# Patient Record
Sex: Female | Born: 1950 | Race: Black or African American | Hispanic: No | Marital: Single | Smoking: Former smoker
Health system: Southern US, Community
[De-identification: ages and names within clinical notes are randomized; demographics above are authoritative.]

## PROBLEM LIST (undated history)

## (undated) DIAGNOSIS — E7800 Pure hypercholesterolemia, unspecified: Secondary | ICD-10-CM

## (undated) DIAGNOSIS — I10 Essential (primary) hypertension: Secondary | ICD-10-CM

## (undated) DIAGNOSIS — J449 Chronic obstructive pulmonary disease, unspecified: Secondary | ICD-10-CM

## (undated) DIAGNOSIS — J45909 Unspecified asthma, uncomplicated: Secondary | ICD-10-CM

## (undated) DIAGNOSIS — R011 Cardiac murmur, unspecified: Secondary | ICD-10-CM

## (undated) DIAGNOSIS — D649 Anemia, unspecified: Secondary | ICD-10-CM

## (undated) DIAGNOSIS — K922 Gastrointestinal hemorrhage, unspecified: Secondary | ICD-10-CM

## (undated) HISTORY — PX: TUBAL LIGATION: SHX77

## (undated) HISTORY — DX: Anemia, unspecified: D64.9

## (undated) HISTORY — PX: HERNIA REPAIR: SHX51

## (undated) HISTORY — DX: Gastrointestinal hemorrhage, unspecified: K92.2

---

## 2005-07-28 ENCOUNTER — Other Ambulatory Visit: Payer: Self-pay

## 2005-07-28 ENCOUNTER — Emergency Department: Payer: Self-pay | Admitting: Emergency Medicine

## 2006-03-16 ENCOUNTER — Inpatient Hospital Stay: Payer: Self-pay | Admitting: Internal Medicine

## 2007-03-28 ENCOUNTER — Ambulatory Visit: Payer: Self-pay

## 2007-04-03 ENCOUNTER — Ambulatory Visit: Payer: Self-pay

## 2007-04-25 ENCOUNTER — Emergency Department: Payer: Self-pay | Admitting: Emergency Medicine

## 2007-04-25 ENCOUNTER — Other Ambulatory Visit: Payer: Self-pay

## 2007-10-10 ENCOUNTER — Ambulatory Visit: Payer: Self-pay

## 2008-03-07 ENCOUNTER — Emergency Department: Payer: Self-pay | Admitting: Emergency Medicine

## 2008-08-26 ENCOUNTER — Emergency Department: Payer: Self-pay | Admitting: Emergency Medicine

## 2009-09-10 ENCOUNTER — Inpatient Hospital Stay: Payer: Self-pay | Admitting: Specialist

## 2009-10-26 ENCOUNTER — Ambulatory Visit: Payer: Self-pay | Admitting: Specialist

## 2011-09-06 ENCOUNTER — Emergency Department: Payer: Self-pay | Admitting: Emergency Medicine

## 2011-09-06 LAB — COMPREHENSIVE METABOLIC PANEL
Albumin: 3 g/dL — ABNORMAL LOW (ref 3.4–5.0)
Anion Gap: 9 (ref 7–16)
BUN: 11 mg/dL (ref 7–18)
Glucose: 108 mg/dL — ABNORMAL HIGH (ref 65–99)
Potassium: 3.3 mmol/L — ABNORMAL LOW (ref 3.5–5.1)
SGOT(AST): 22 U/L (ref 15–37)
SGPT (ALT): 17 U/L
Total Protein: 8.2 g/dL (ref 6.4–8.2)

## 2011-09-06 LAB — CK TOTAL AND CKMB (NOT AT ARMC)
CK, Total: 112 U/L (ref 21–215)
CK-MB: 0.7 ng/mL (ref 0.5–3.6)

## 2011-09-06 LAB — CBC
HCT: 38.2 % (ref 35.0–47.0)
HGB: 12.3 g/dL (ref 12.0–16.0)
MCH: 25 pg — ABNORMAL LOW (ref 26.0–34.0)
MCV: 78 fL — ABNORMAL LOW (ref 80–100)
Platelet: 319 10*3/uL (ref 150–440)
RBC: 4.91 10*6/uL (ref 3.80–5.20)
RDW: 17.5 % — ABNORMAL HIGH (ref 11.5–14.5)
WBC: 9.8 10*3/uL (ref 3.6–11.0)

## 2011-09-06 LAB — URINALYSIS, COMPLETE
Bacteria: NONE SEEN
Glucose,UR: NEGATIVE mg/dL (ref 0–75)
Leukocyte Esterase: NEGATIVE
Nitrite: NEGATIVE
Protein: NEGATIVE
RBC,UR: 1 /HPF (ref 0–5)
WBC UR: 1 /HPF (ref 0–5)

## 2012-01-08 ENCOUNTER — Emergency Department: Payer: Self-pay | Admitting: Unknown Physician Specialty

## 2012-01-08 LAB — CBC
HCT: 38.9 % (ref 35.0–47.0)
HGB: 12.9 g/dL (ref 12.0–16.0)
MCH: 26 pg (ref 26.0–34.0)
MCHC: 33.2 g/dL (ref 32.0–36.0)
MCV: 78 fL — ABNORMAL LOW (ref 80–100)
Platelet: 325 10*3/uL (ref 150–440)
RBC: 4.97 10*6/uL (ref 3.80–5.20)
RDW: 17.5 % — ABNORMAL HIGH (ref 11.5–14.5)
WBC: 9.3 10*3/uL (ref 3.6–11.0)

## 2012-01-08 LAB — COMPREHENSIVE METABOLIC PANEL
Albumin: 3.1 g/dL — ABNORMAL LOW (ref 3.4–5.0)
Alkaline Phosphatase: 152 U/L — ABNORMAL HIGH (ref 50–136)
Anion Gap: 8 (ref 7–16)
BUN: 11 mg/dL (ref 7–18)
Calcium, Total: 8.9 mg/dL (ref 8.5–10.1)
Co2: 24 mmol/L (ref 21–32)
EGFR (Non-African Amer.): 45 — ABNORMAL LOW
Glucose: 88 mg/dL (ref 65–99)
Potassium: 3.9 mmol/L (ref 3.5–5.1)
SGPT (ALT): 18 U/L (ref 12–78)
Sodium: 141 mmol/L (ref 136–145)

## 2012-01-08 LAB — SEDIMENTATION RATE: Erythrocyte Sed Rate: 18 mm/hr (ref 0–30)

## 2012-04-11 ENCOUNTER — Ambulatory Visit: Payer: Self-pay

## 2012-06-05 ENCOUNTER — Emergency Department: Payer: Self-pay | Admitting: Emergency Medicine

## 2012-06-05 LAB — BASIC METABOLIC PANEL
BUN: 11 mg/dL (ref 7–18)
Calcium, Total: 8.8 mg/dL (ref 8.5–10.1)
Chloride: 103 mmol/L (ref 98–107)
Co2: 26 mmol/L (ref 21–32)
Creatinine: 1.58 mg/dL — ABNORMAL HIGH (ref 0.60–1.30)
EGFR (Non-African Amer.): 35 — ABNORMAL LOW
Glucose: 104 mg/dL — ABNORMAL HIGH (ref 65–99)
Osmolality: 272 (ref 275–301)
Potassium: 3.6 mmol/L (ref 3.5–5.1)
Sodium: 136 mmol/L (ref 136–145)

## 2012-06-05 LAB — CBC
HCT: 39.3 % (ref 35.0–47.0)
HGB: 13 g/dL (ref 12.0–16.0)
MCH: 25.4 pg — ABNORMAL LOW (ref 26.0–34.0)
MCV: 77 fL — ABNORMAL LOW (ref 80–100)
Platelet: 342 10*3/uL (ref 150–440)
RBC: 5.11 10*6/uL (ref 3.80–5.20)
WBC: 5.9 10*3/uL (ref 3.6–11.0)

## 2012-09-19 ENCOUNTER — Ambulatory Visit: Payer: Self-pay | Admitting: Family Medicine

## 2013-04-14 ENCOUNTER — Emergency Department: Payer: Self-pay | Admitting: Emergency Medicine

## 2013-04-14 LAB — COMPREHENSIVE METABOLIC PANEL
ALT: 15 U/L (ref 12–78)
ANION GAP: 6 — AB (ref 7–16)
Albumin: 3.1 g/dL — ABNORMAL LOW (ref 3.4–5.0)
Alkaline Phosphatase: 137 U/L — ABNORMAL HIGH
BUN: 12 mg/dL (ref 7–18)
Bilirubin,Total: 0.4 mg/dL (ref 0.2–1.0)
Calcium, Total: 9 mg/dL (ref 8.5–10.1)
Chloride: 107 mmol/L (ref 98–107)
Co2: 24 mmol/L (ref 21–32)
Creatinine: 1.43 mg/dL — ABNORMAL HIGH (ref 0.60–1.30)
EGFR (African American): 46 — ABNORMAL LOW
GFR CALC NON AF AMER: 40 — AB
GLUCOSE: 92 mg/dL (ref 65–99)
Osmolality: 273 (ref 275–301)
Potassium: 3.8 mmol/L (ref 3.5–5.1)
SGOT(AST): 13 U/L — ABNORMAL LOW (ref 15–37)
SODIUM: 137 mmol/L (ref 136–145)
TOTAL PROTEIN: 8.2 g/dL (ref 6.4–8.2)

## 2013-04-14 LAB — CBC
HCT: 41.4 % (ref 35.0–47.0)
HGB: 13.3 g/dL (ref 12.0–16.0)
MCH: 25.7 pg — ABNORMAL LOW (ref 26.0–34.0)
MCHC: 32.1 g/dL (ref 32.0–36.0)
MCV: 80 fL (ref 80–100)
Platelet: 308 10*3/uL (ref 150–440)
RBC: 5.18 10*6/uL (ref 3.80–5.20)
RDW: 15.9 % — ABNORMAL HIGH (ref 11.5–14.5)
WBC: 12.2 10*3/uL — ABNORMAL HIGH (ref 3.6–11.0)

## 2013-04-17 ENCOUNTER — Inpatient Hospital Stay: Payer: Self-pay | Admitting: Specialist

## 2013-04-17 LAB — COMPREHENSIVE METABOLIC PANEL
AST: 38 U/L — AB (ref 15–37)
Albumin: 2.4 g/dL — ABNORMAL LOW (ref 3.4–5.0)
Alkaline Phosphatase: 116 U/L
Anion Gap: 1 — ABNORMAL LOW (ref 7–16)
BUN: 14 mg/dL (ref 7–18)
Bilirubin,Total: 0.4 mg/dL (ref 0.2–1.0)
CHLORIDE: 106 mmol/L (ref 98–107)
CREATININE: 0.99 mg/dL (ref 0.60–1.30)
Calcium, Total: 8.6 mg/dL (ref 8.5–10.1)
Co2: 26 mmol/L (ref 21–32)
EGFR (African American): >60
EGFR (Non-African Amer.): >60
Glucose: 90 mg/dL (ref 65–99)
Osmolality: 266 (ref 275–301)
Potassium: 5.7 mmol/L — ABNORMAL HIGH (ref 3.5–5.1)
SGPT (ALT): 12 U/L (ref 12–78)
SODIUM: 133 mmol/L — AB (ref 136–145)
Total Protein: 7.4 g/dL (ref 6.4–8.2)

## 2013-04-17 LAB — CBC WITH DIFFERENTIAL/PLATELET
BASOS ABS: 0.1 10*3/uL (ref 0.0–0.1)
Basophil %: 0.4 %
EOS ABS: 0 10*3/uL (ref 0.0–0.7)
Eosinophil %: 0.3 %
HCT: 35.3 % (ref 35.0–47.0)
HGB: 11.7 g/dL — ABNORMAL LOW (ref 12.0–16.0)
Lymphocyte #: 1 10*3/uL (ref 1.0–3.6)
Lymphocyte %: 6.8 %
MCH: 26.2 pg (ref 26.0–34.0)
MCHC: 33.2 g/dL (ref 32.0–36.0)
MCV: 79 fL — ABNORMAL LOW (ref 80–100)
MONO ABS: 0.8 x10 3/mm (ref 0.2–0.9)
Monocyte %: 5.8 %
NEUTROS PCT: 86.7 %
Neutrophil #: 12.1 10*3/uL — ABNORMAL HIGH (ref 1.4–6.5)
PLATELETS: 288 10*3/uL (ref 150–440)
RBC: 4.48 10*6/uL (ref 3.80–5.20)
RDW: 15.6 % — ABNORMAL HIGH (ref 11.5–14.5)
WBC: 14 10*3/uL — ABNORMAL HIGH (ref 3.6–11.0)

## 2013-04-17 LAB — CK: CK, TOTAL: 75 U/L

## 2013-04-18 LAB — BASIC METABOLIC PANEL
Anion Gap: 6 — ABNORMAL LOW (ref 7–16)
BUN: 17 mg/dL (ref 7–18)
CALCIUM: 9.4 mg/dL (ref 8.5–10.1)
CO2: 26 mmol/L (ref 21–32)
CREATININE: 1.28 mg/dL (ref 0.60–1.30)
Chloride: 104 mmol/L (ref 98–107)
EGFR (African American): 53 — ABNORMAL LOW
EGFR (Non-African Amer.): 45 — ABNORMAL LOW
Glucose: 107 mg/dL — ABNORMAL HIGH (ref 65–99)
Osmolality: 274 (ref 275–301)
Potassium: 4 mmol/L (ref 3.5–5.1)
Sodium: 136 mmol/L (ref 136–145)

## 2013-04-18 LAB — CBC WITH DIFFERENTIAL/PLATELET
BASOS PCT: 0.2 %
Basophil #: 0 10*3/uL (ref 0.0–0.1)
EOS ABS: 0 10*3/uL (ref 0.0–0.7)
Eosinophil %: 0 %
HCT: 37.7 % (ref 35.0–47.0)
HGB: 11.9 g/dL — ABNORMAL LOW (ref 12.0–16.0)
Lymphocyte #: 0.8 10*3/uL — ABNORMAL LOW (ref 1.0–3.6)
Lymphocyte %: 4.3 %
MCH: 24.8 pg — ABNORMAL LOW (ref 26.0–34.0)
MCHC: 31.4 g/dL — ABNORMAL LOW (ref 32.0–36.0)
MCV: 79 fL — ABNORMAL LOW (ref 80–100)
MONO ABS: 1 x10 3/mm — AB (ref 0.2–0.9)
MONOS PCT: 5.7 %
Neutrophil #: 16.1 10*3/uL — ABNORMAL HIGH (ref 1.4–6.5)
Neutrophil %: 89.8 %
PLATELETS: 348 10*3/uL (ref 150–440)
RBC: 4.78 10*6/uL (ref 3.80–5.20)
RDW: 15.9 % — AB (ref 11.5–14.5)
WBC: 18 10*3/uL — ABNORMAL HIGH (ref 3.6–11.0)

## 2013-04-19 LAB — CBC WITH DIFFERENTIAL/PLATELET
Basophil #: 0.1 10*3/uL (ref 0.0–0.1)
Basophil %: 0.3 %
Eosinophil #: 0 10*3/uL (ref 0.0–0.7)
Eosinophil %: 0 %
HCT: 40.9 % (ref 35.0–47.0)
HGB: 13.3 g/dL (ref 12.0–16.0)
LYMPHS ABS: 1.4 10*3/uL (ref 1.0–3.6)
Lymphocyte %: 7.2 %
MCH: 25.9 pg — ABNORMAL LOW (ref 26.0–34.0)
MCHC: 32.6 g/dL (ref 32.0–36.0)
MCV: 80 fL (ref 80–100)
Monocyte #: 1 x10 3/mm — ABNORMAL HIGH (ref 0.2–0.9)
Monocyte %: 5 %
NEUTROS ABS: 17.5 10*3/uL — AB (ref 1.4–6.5)
Neutrophil %: 87.5 %
Platelet: 421 10*3/uL (ref 150–440)
RBC: 5.14 10*6/uL (ref 3.80–5.20)
RDW: 16 % — ABNORMAL HIGH (ref 11.5–14.5)
WBC: 20 10*3/uL — ABNORMAL HIGH (ref 3.6–11.0)

## 2013-04-22 LAB — CULTURE, BLOOD (SINGLE)

## 2013-11-14 IMAGING — MG MAM BCCCP DIG SCREEN MAM W/CAD
1 series · 5 of 5 positions shown · non-contrast
Comparison: none

REASON FOR EXAM: screening
COMMENTS:

PROCEDURE:     MAM - MAM [REDACTED] DIG SCREEN MAM W/CAD  - April 11, 2012 [DATE]
RESULT:     Bilateral digital screening mammogram with CAD dated 04/11/2012
Comparison study/studies dated: 03/28/2007

[R CC · right · 5 of 5 slices shown]
[im 1/5]
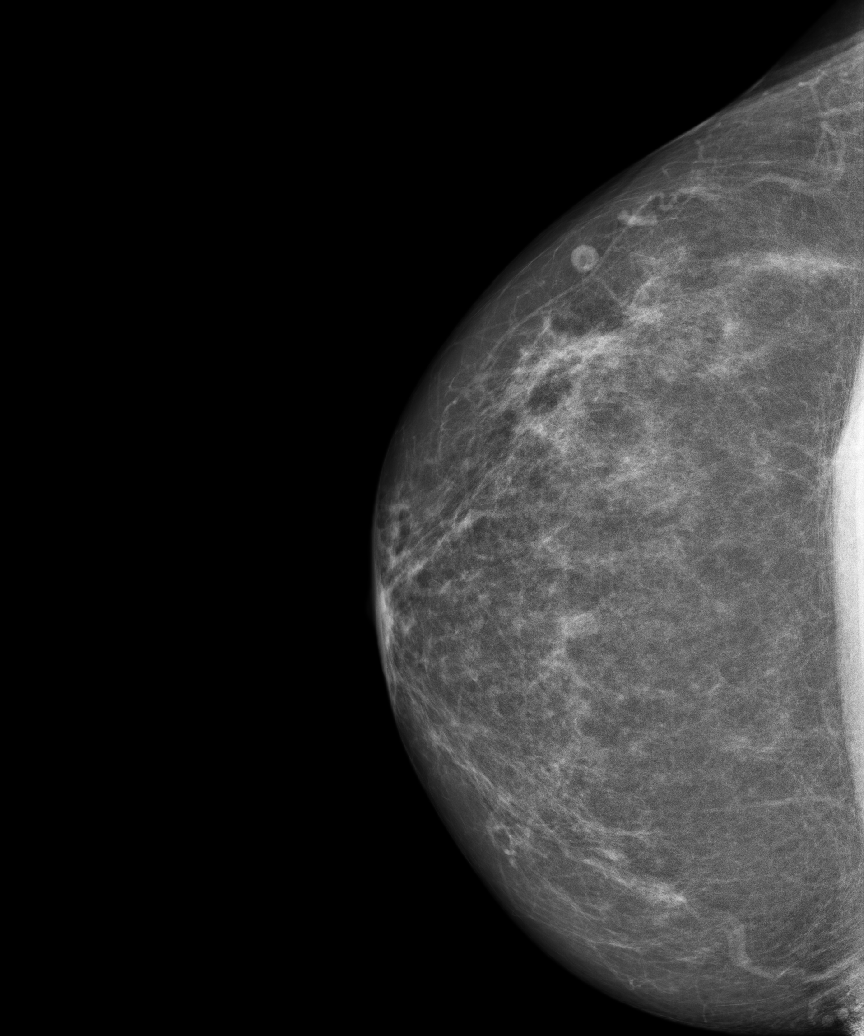
[im 2/5]
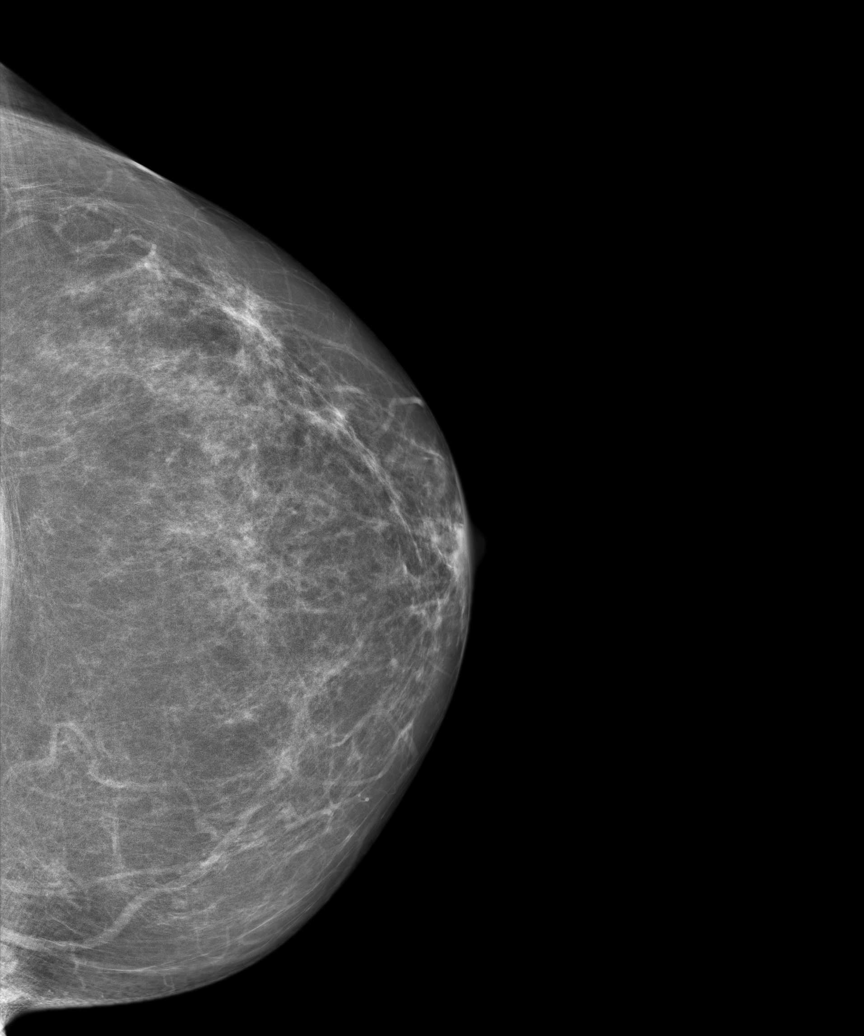
[im 3/5]
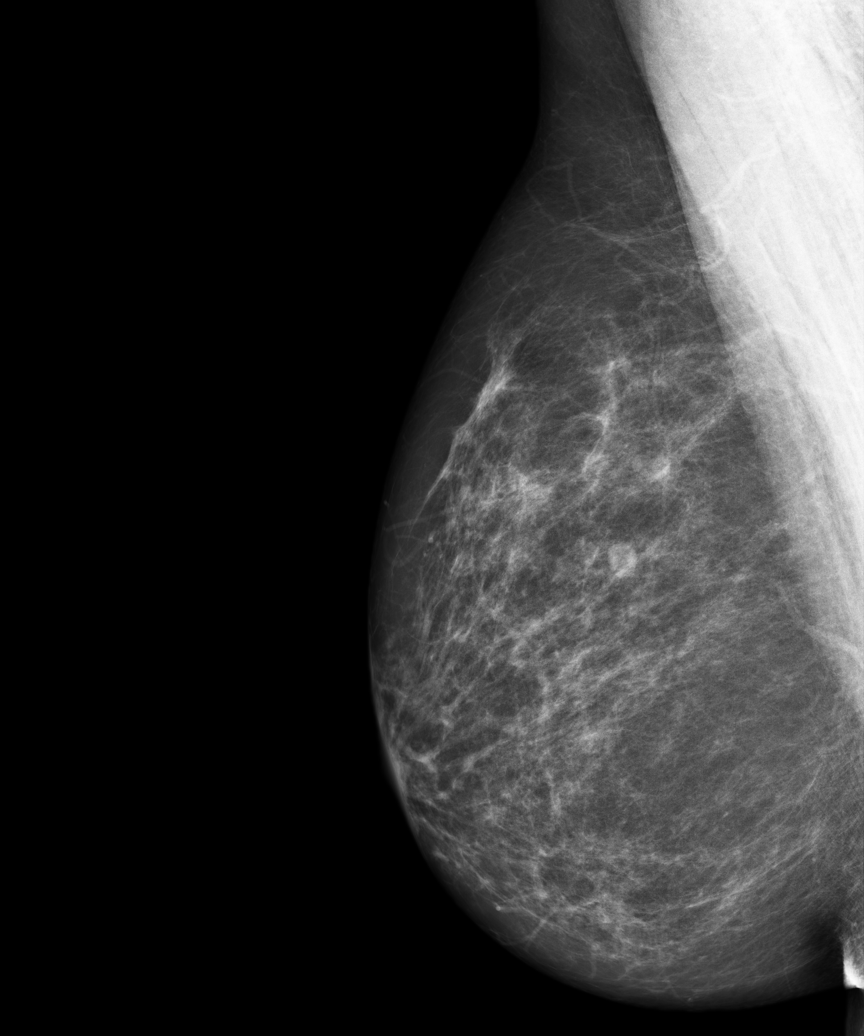
[im 4/5]
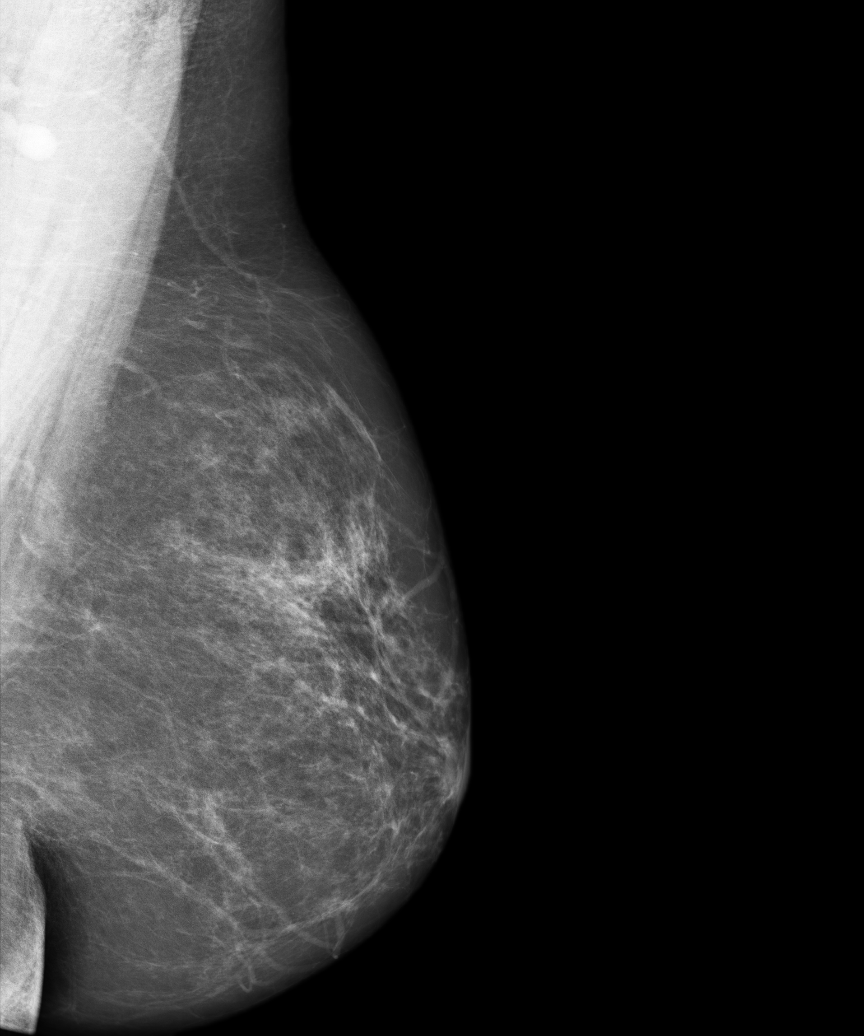
[im 5/5]
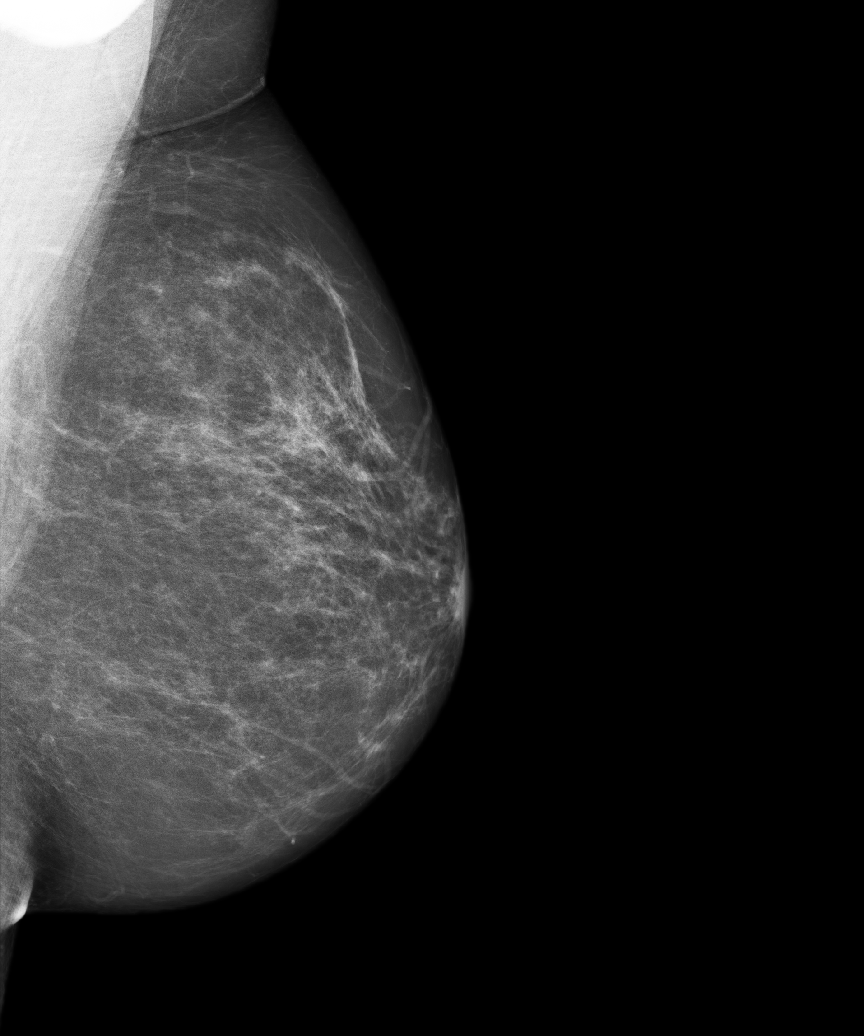

[5 of 5 positions shown; findings below may reference images not displayed]

FINDINGS: BREAST COMPOSITION: The breast composition is SCATTERED
FIBROGLANDULAR TISSUE (glandular tissue is 25-50%)

There is no radiographic evidence of malignant type calcifications ,
architectural distortion, spiculated masses or nodules.

A stable nodular density projects in the superior lateral portion of the
right breast with a lucent center consistent with a benign intramammary
lymph node.
IMPRESSION: BI-RADS: Category 2- Benign Finding

A NEGATIVE MAMMOGRAM REPORT DOES NOT PRECLUDE BIOPSY OR OTHER EVALUATION OF
A CLINICALLY PALPABLE OR OTHERWISE SUSPICIOUS MASS OR LESION. BREAST CANCER
MAY NOT BE DETECTED IN UP TO 10% OF CASES.

## 2014-02-03 ENCOUNTER — Ambulatory Visit: Payer: Self-pay | Admitting: Physician Assistant

## 2014-03-11 ENCOUNTER — Ambulatory Visit: Payer: Self-pay | Admitting: Otolaryngology

## 2014-05-30 ENCOUNTER — Ambulatory Visit
Admit: 2014-05-30 | Disposition: A | Discharge status: Home / Self Care | Payer: Self-pay | Attending: Unknown Physician Specialty | Admitting: Unknown Physician Specialty

## 2014-05-30 LAB — RAPID URINE DRUG SCREEN, HOSP PERFORMED
Amphetamines, Ur Screen: NEGATIVE (ref ?–1000)
BENZODIAZEPINE, UR SCRN: NEGATIVE (ref ?–200)
Barbiturates, Ur Screen: NEGATIVE (ref ?–200)
COCAINE METABOLITE, UR ~~LOC~~: POSITIVE (ref ?–300)
Cannabinoid 50 Ng, Ur ~~LOC~~: NEGATIVE (ref ?–50)
Opiate, Ur Screen: NEGATIVE (ref ?–300)

## 2014-06-21 NOTE — H&P (Signed)
PATIENT NAME:  Susan Jacobs, Susan Jacobs MR#:  [id number] DATE OF BIRTH:  07-01-1950  DATE OF ADMISSION:  04/17/2013  PRIMARY CARE PROVIDER: Bernestine Amass.   EMERGENCY DEPARTMENT REFERRING PHYSICIAN: Kathreen Devoid. Paduchowski, MD  CHIEF COMPLAINT: Right neck swelling.   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 The patient was seen in the ED and was started on treatment with p.o. clindamycin and p.o. prednisone. She continued to not improve; therefore, came back to the ED. She had a repeat CT scan of the neck which showed similar type of findings. The ED physician spoke to Dr. Willeen Cass, who recommended the patient be hospitalized for IV steroids and IV antibiotics. The patient denies any fevers or chills. Complains of some shortness of breath. Due to the neck being swollen, also complains of difficulty with swallowing solid foods. She denies any chest pains, palpitations. Denies any nausea, vomiting or diarrhea. Denies any urinary symptoms.   PAST MEDICAL HISTORY:  1. History of endometriosis.  2. GERD.  3. Chronic low back pain.  4. Hypertension.  5. Hyperlipidemia.   PAST SURGICAL HISTORY: Status post appendectomy, surgery for ectopic pregnancy, bilateral oophorectomy.   ALLERGIES: ASPIRIN. SHE STATES THAT SHE IS ONLY ALLERGIC TO BIG ASPIRINS AND NOT THE SMALLER ASPIRINS.   SOCIAL HISTORY: Smokes half-pack per day. Drinks 40-ounce beer every other day. No drug use.   FAMILY HISTORY: Positive for hypertension.   REVIEW OF SYSTEMS:  CONSTITUTIONAL: Denies any fevers. No fatigue. No weakness. Complains of pain in the right neck. Denies any weight loss, weight  gain.  EYES: No blurred or double vision. No pain. No redness. No inflammation. Has cataracts.  ENT: No tinnitus. No ear pain. No hearing loss. No seasonal or year-round allergies. Complains of difficulty swallowing, pain in her teeth on the right side.  LUNGS: No cough. No wheezing. No hemoptysis. No COPD.  CARDIOVASCULAR: Denies any chest pain, orthopnea, edema or arrhythmia.  GASTROINTESTINAL: No nausea, vomiting, diarrhea. No abdominal pain. No hematemesis. No melena. No ulcer. No changes in bowel habits.  GENITOURINARY: Denies any dysuria, hematuria, renal calculus or frequency.  ENDOCRINE: Denies any polyuria, nocturia or thyroid problems. HEMATOLOGIC AND LYMPHATIC: Denies any major bruisability or bleeding.  SKIN: No acne. No rash. No changes in mole, hair or skin.  MUSCULOSKELETAL: Complains of neck pain and lower back pain.  NEUROLOGIC: No numbness. No CVA. No TIA. No seizures.  PSYCHIATRIC: No anxiety. No insomnia. No ADD.   PHYSICAL EXAMINATION:  VITAL SIGNS: Temperature 99, pulse 113, respirations 20, blood pressure 137/73, O2 98% on room air.  GENERAL: The patient is a morbidly obese female in no acute distress.  HEENT: Head is atraumatic, normocephalic. Pupils equally round and reactive to light and accommodation. There is no conjunctival pallor. No scleral icterus. Nasal exam shows no drainage or ulceration. Oropharynx: She has limited movement. I am not able to see the posterior aspect of her mouth or her teeth that she has the pain in. NECK: Right neck is swollen as well as the right side of her lower face. She has a chronic cyst in the right cheek region. That has been there for a long  time. There is some warmth to the neck to touch. There is no fluctuance noted by me.  CARDIOVASCULAR: Regular rate and rhythm. No murmurs, rubs, clicks or gallops.  LUNGS: Clear to auscultation bilaterally without any rales, rhonchi or wheezing.  ABDOMEN: Soft, nontender, nondistended. Positive  bowel sounds x4. No hepatosplenomegaly.  EXTREMITIES: No clubbing, cyanosis or edema.  SKIN: There is no erythema or rash.  LYMPHATICS: No lymph nodes palpable.  VASCULAR: Good DP, PT pulses.  PSYCHIATRIC: Not anxious or depressed.  NEUROLOGIC: Awake, alert, oriented x3. No focal deficits.   LABORATORY DATA: Glucose 90, BUN 14, creatinine 0.99, sodium 133, potassium was 5.7, chloride 106, CO2 is 26, calcium is 8.6, total protein 7.4, albumin 2.4, bilirubin total 0.4, alkaline phosphatase 116, AST 38. WBC 14.0, hemoglobin 11.7, platelet count 287. CT scan of the neck is currently pending. CT scan of the neck done on Sundays shows mild enlargement of the right submandibular gland with ill-definition adjacent fat planes, with small adjacent reactive lymph nodes, findings suggestive of inflammatory infectious process. No evidence of abscess. There is adjacent dental disease involving most posterior right lower molar tooth, well-defined ovoid homogeneous cystic structure within the subcutaneous fat.   ASSESSMENT AND PLAN: The patient is a 64 year old African-American female with history of hypertension, hyperlipidemia, who has had trouble with a tooth on the right, who presents with right neck swelling. Failure to improve with outpatient therapy.   1. Right neck cellulitis as well as sialoadenitis. At this time, ED MD has spoken to Dr. Willeen CassBennett of ENT, who recommends IV antibiotics and IV steroids. Will admit her, place her on IV Unasyn and IV Decadron. Monitor her for any further symptoms. I will ask Dr. Willeen CassBennett to come see the patient to see if there is any drainage that needs to be done. The patient will need outpatient followup for her dental disease. [name] a Education officer, communitydentist. She stated that she will be making an appointment.  2. Hypertension. Will continue hydrochlorothiazide.  3. Hyperlipidemia. Continue simvastatin.  4. Hyperkalemia, likely hemolysis-related. I am going to repeat another  potassium to see if this is an error.  5. Nicotine addiction. Smoking cessation, 4 minutes spent. The patient will be started on nicotine patch.   TIME SPENT: 50 minutes spent on the H and P.    ____________________________ Lacie ScottsShreyang H. Allena KatzPatel, MD shp:lb D: 04/17/2013 13:33:28 ET T: 04/17/2013 13:43:33 ET JOB#: [id number]  cc: Jaquelin Meaney H. Allena KatzPatel, MD, <Dictator> Charise CarwinSHREYANG H Sergio Hobart MD ELECTRONICALLY SIGNED 04/19/2013 14:40

## 2014-06-21 NOTE — Discharge Summary (Signed)
PATIENT NAME:  Susan Jacobs, Susan B MR#:  [id number] DATE OF BIRTH:  [birth year]  DATE OF ADMISSION:  04/17/2013 DATE OF DISCHARGE:  04/19/2013  For a detailed note, please take a look at the history and physical done on admission by Dr. Auburn BilberryShreyang Patel.   DISCHARGE DIAGNOSIS:  1.  Right-sided neck and facial cellulitis and also sialoadenitis.  2.  Underlying dental abscess. 3.  Hypertension. 4.  Hyperlipidemia. 5.  Leukocytosis.   DISCHARGE DIET: Low-sodium, low-fat.  ACTIVITY: As tolerated.   DISCHARGE FOLLOWUP: [facility]   DISCHARGE MEDICATIONS: Aspirin 81 mg daily, coenzyme-Q 1 tab daily, ranitidine 150 mg b.i.d., HCTZ 25 mg daily, Pravachol 40 mg daily, clindamycin 300 mg q. 6 hours x7 days, Tylenol with oxycodone 5/325 one tab q. 8 hours as needed for pain, prednisone 20 mg daily.   CONSULTANTS DURING THE HOSPITAL COURSE: Dr. Geanie LoganPaul Bennett from ENT.  PERTINENT STUDIES DONE DURING THE HOSPITAL COURSE: CT scan of the neck done with contrast on February 18th showed right submandibular sialoadenitis which has progressed since 3 days prior, slight submandibular abscess.   HOSPITAL COURSE: This is a 64 year old female with medical problems as mentioned above who presented to the hospital on April 17, 2013 due to right-sided neck pain and swelling, progressively getting worse.  1.  Right-sided neck sialoadenitis and cellulitis. The patient apparently was being followed by  ENT as an outpatient and had a CT neck done which showed a submandibular abscess. It was not improving with oral antibiotics and steroids as outpatient. Therefore, she was admitted for treatment as an inpatient with IV steroids and IV antibiotics. After getting a couple days of IV antibiotics and steroids her swelling and symptoms have significantly improved. She does have a leukocytosis, but it is likely steroid mediated. The patient is currently afebrile. ENT did see the  patient, attempted to do a percutaneous drainage, but they were unable to do so. The patient likely needs to have the dental abscess drained and the infected tooth removed, which is to be done as an outpatient by a dentist. At this point, the patient is being discharged on oral clindamycin and prednisone, follow with her primary care physician, and to eventually have the dental abscess drained and the tooth removed.  2.  Hypertension. The patient remained hemodynamically stable on HCTZ. She will resume that. 3.  Hyperlipidemia. The patient was maintained on her Pravachol and she will resume that.  4.  Leukocytosis. This is partly secondary to the infection and also partly steroid mediated and needs to be further followed as an outpatient.   CODE STATUS: The patient is a FULL code.   TIME SPENT: 40 minutes. ____________________________ Rolly PancakeVivek J. Cherlynn KaiserSainani, MD vjs:sb D: 04/19/2013 15:51:55 ET T: 04/19/2013 16:31:05 ET JOB#: [id number]  cc: Rolly PancakeVivek J. Cherlynn KaiserSainani, MD, <Dictator> [facility] SirenVIVEK J SAINANI MD ELECTRONICALLY SIGNED 04/26/2013 18:04

## 2014-06-21 NOTE — Consult Note (Signed)
Details:   - Full note dictated. Patient has a dental infection from a decayed molar, which needs to be extracted. There was a small area of possible abscess collection adjacent to the submandibular gland on CT, not seen on Sunday's scan. I attempted needle aspiration, but no purulence was obtained. Fortunately she has improved with her initial doses of Unasyn and steroids. Will follow clinically and hopefully will see continued progress without further attempts to drain anything. I have increased her Unasyn to Q 6 hours, given the severity of the infection.   - Full note dictated. Patient has a dental infection from a decayed molar, which needs to be extracted. There was a small area of possible abscess collection adjacent to the submandibular gland on CT, not seen on Sunday's scan. I attempted needle aspiration, but no purulence was obtained. Fortunately she has improved with her initial doses of Unasyn and steroids. Will follow clinically and hopefully will see continued progress without further attempts to drain anything. I have increased her Unasyn to Q 6 hours, given the severity of the infection.   Electronic Signatures: Sandi MealyBennett, Dorna Mallet S (MD)  (Signed 18-Feb-15 18:26)  Authored: Details   Last Updated: 18-Feb-15 18:26 by Sandi MealyBennett, Samil Mecham S (MD)

## 2014-06-21 NOTE — Op Note (Signed)
PATIENT NAME:  Susan GeorgiaWILEY, Daishia B MR#:  [id number] DATE OF BIRTH:  1950-03-11  DATE OF PROCEDURE:  04/17/2013  PREOPERATIVE DIAGNOSIS:  Cellulitis with possible abscess right submandibular space.   POSTOPERATIVE DIAGNOSIS:  Cellulitis with possible abscess right submandibular space.  PROCEDURE:  Needle aspiration of right submandibular space.   SURGEON: Marion DownerScott Pearson Reasons, MD   ANESTHESIA: 1% lidocaine with epinephrine 1:100,000.   DESCRIPTION OF PROCEDURE: After discussing the procedure with the patient, the skin was prepped with Betadine and anesthetized with 1% lidocaine with epinephrine 1:100,000. An 18-gauge needle was then passed through the skin into the submandibular space adjacent to the body of the mandible over the area of greatest swelling. The needle was redirected several times attempting to find an abscess cavity and drain it. A small amount of blood was obtained, but no purulence. The procedure was uncomfortable. However, the patient did tolerate this reasonably well and bleeding was minimal.    ____________________________ Ollen GrossPaul S. Willeen CassBennett, MD psb:ce D: 04/17/2013 18:20:54 ET T: 04/17/2013 19:11:11 ET JOB#: [id number]  cc: Ollen GrossPaul S. Willeen CassBennett, MD, <Dictator> Sandi MealyPAUL S Eilidh Marcano MD ELECTRONICALLY SIGNED 04/24/2013 10:20

## 2014-06-21 NOTE — Consult Note (Signed)
PATIENT NAME:  Susan Jacobs, Susan Jacobs MR#:  [id number] DATE OF BIRTH:  April 25, 1950  DATE OF CONSULTATION:  04/17/2013  REFERRING PHYSICIAN:  Su Leyobert L. Kinner, MD CONSULTING PHYSICIAN:  Ollen GrossPaul S. Willeen CassBennett, MD  REASON FOR CONSULTATION: Neck infection.   HISTORY OF PRESENT ILLNESS: This 64 year old female with a painful right mandibular molar presented to the Emergency Room Sunday with swelling in her neck. She was apparently put on clindamycin after a CT scan showed no evidence of drainable abscess. She returned to the Emergency Room today with difficulty swallowing and worsening swelling. She has not had any fevers or chills. She has contacted a dentist about getting the tooth removed, but they were not able to remove it this week. She did have some slight shortness of breath during the course of this, but since admission to the hospital earlier today with administration of IV Unasyn and prednisone, her swallowing is back to normal and she denies difficulty swallowing or breathing. She has not had any nausea, vomiting, or diarrhea. CT imaging indicated swelling in the right submandibular area, and there is lucency around the posteriormost molar on that side. There was a low-density area, maybe 2 x 1 cm, adjacent to the submandibular gland in the right submandibular space potentially representing a small abscess collection.   PAST MEDICAL HISTORY: Reflux, chronic low back pain, hypertension, hyperlipidemia. She has a long-standing cyst in the right lower face, which was seen on the CT scan, involving the subcutaneous tissues. She [place] [facility]. History of endometriosis.   PAST SURGICAL HISTORY: Prior appendectomy, surgery for ectopic pregnancy, bilateral oophorectomy.   ALLERGIES: ASPIRIN.   SOCIAL HISTORY: She smokes 1/2 pack of cigarettes a day and drinks a 40-ounce beer every other day. No drug use.   FAMILY HISTORY: Positive for hypertension.   MEDICATIONS: Tylenol  650 mg p.o. q.4 hours p.r.n. pain or temperature, Norco 1 to 2 every 4 hours p.r.n. pain, Unasyn 3 grams IV q.8 hours, aspirin 81 mg p.o. daily, dexamethasone 4 mg IV q.8 hours, hydrochlorothiazide 25 mg p.o. daily, sliding scale insulin, Habitrol patch 14 mg transdermally daily, Zofran 4 mg IV q.4 hours p.r.n. nausea/vomiting, pravastatin 40 mg p.o. at bedtime, Zantac 150 mg p.o. q.12 hours.  REVIEW OF SYSTEMS: Negative for fever, fatigue, weakness, weight loss, tinnitus, ear pain, cough, chest pain, nausea, vomiting, diarrhea.   PHYSICAL EXAMINATION: VITAL SIGNS: Temperature 97.4, pulse 81, blood pressure 133/72.  GENERAL: Well-developed, well-nourished female in no acute distress with no stridor or labored breathing. HEAD AND FACE: Head is normocephalic, atraumatic, with a large 2 cm cyst in the right subcutaneous tissues over the mandible which is nontender.  EARS: External ears, ear canals, tympanic membranes are clear bilaterally.  NOSE: External nose unremarkable. Nasal cavity is clear. No purulence or polyps are seen.  ORAL CAVITY AND OROPHARYNX: Lips, tongue, and floor of mouth are unremarkable. There is no evidence of any swelling in the floor of the mouth. She has a decayed molar tooth involving the right posterior mandibular dentition. Posterior pharynx is clear without edema or erythema or exudate.  NECK: Supple with tenderness in the right submandibular region. The area is firm without any palpable fluctuance. The parotid gland is soft and nontender. There is no thyromegaly.   An attempt was made to aspirate any fluid collection from around the right submandibular space using an 18-gauge needle. No purulence was obtained.   ASSESSMENT: This patient has a dental infection with cellulitis  in the right submandibular space. She has actually improved in terms of symptoms fairly quickly on IV antibiotics and steroids. I was unable to aspirate any purulence from the submandibular space,  although this is a fairly small area that was seen on CT scan. It may be amenable to IV antibiotics alone.   PLAN: I will increase the Unasyn to q.6 hours and continue the steroids, treating aggressively with IV antibiotics in hopes of improving this without the need for incision and drainage since this is a very small area of loculation seen on the CT scan. I will be happy to follow her as well for clinical improvement. If she improves in the hospital, she could be discharged on the clindamycin but should follow up with her dentist as soon as possible to have the decayed tooth extracted to prevent reinfection.   ____________________________ Ollen Gross. Willeen Cass, MD psb:jcm D: 04/17/2013 18:19:02 ET T: 04/17/2013 19:13:25 ET JOB#: [id number]  cc: Ollen Gross. Willeen Cass, MD, <Dictator> Sandi Mealy MD ELECTRONICALLY SIGNED 04/24/2013 10:20

## 2014-06-23 LAB — SURGICAL PATHOLOGY

## 2014-07-27 ENCOUNTER — Emergency Department
Admission: EM | Admit: 2014-07-27 | Discharge: 2014-07-27 | Disposition: A | Discharge status: Home / Self Care | Payer: Medicaid Other | Attending: Emergency Medicine | Admitting: Emergency Medicine

## 2014-07-27 DIAGNOSIS — J40 Bronchitis, not specified as acute or chronic: Secondary | ICD-10-CM

## 2014-07-27 DIAGNOSIS — I10 Essential (primary) hypertension: Secondary | ICD-10-CM | POA: Insufficient documentation

## 2014-07-27 DIAGNOSIS — J449 Chronic obstructive pulmonary disease, unspecified: Secondary | ICD-10-CM | POA: Diagnosis not present

## 2014-07-27 DIAGNOSIS — J0100 Acute maxillary sinusitis, unspecified: Secondary | ICD-10-CM | POA: Diagnosis not present

## 2014-07-27 DIAGNOSIS — J029 Acute pharyngitis, unspecified: Secondary | ICD-10-CM | POA: Diagnosis present

## 2014-07-27 MED ORDER — AZITHROMYCIN 250 MG PO TABS: ORAL_TABLET | ORAL | Status: DC

## 2014-07-27 MED ORDER — BENZONATATE 100 MG PO CAPS: 100.0000 mg | ORAL_CAPSULE | Freq: Three times a day (TID) | ORAL | Status: AC

## 2014-07-27 NOTE — ED Provider Notes (Signed)
[facility] Emergency Department Provider Note  ____________________________________________  Time seen: Approximately 4:43 PM  I have reviewed the triage vital signs and the nursing notes.   HISTORY  Chief Complaint Sore Throat and Nasal Congestion   HPI Susan Jacobs is a 64 y.o. female presents to the ER for complaints of runny nose, sinus congestion, sore throat and cough times approximately 5-6 days. States initially just runny nose. Patient reports over the last 2-3 days cough and sinus pressure has increased. States thick mucous from nose. Denies fever. Reports continues to eat and drink well.  Denies chest pain or shortness of breath.   Medical history: HTN COPD Hyperlipidemia  There are no active problems to display for this patient.   No past surgical history on file.  No current outpatient prescriptions on file.  Albuterol PRN Qvar losartaan hctz  Allergies Review of patient's allergies indicates not on file.  No family history on file.  Social History History  Substance Use Topics  . Smoking status: Not on file  . Smokeless tobacco: Not on file  . Alcohol Use: Not on file    Review of Systems Constitutional: No fever/chills Eyes: No visual changes. ENT: positive for sore throat and congestion Cardiovascular: Denies chest pain. Respiratory: Denies shortness of breath or wheezing.  Gastrointestinal: No abdominal pain.  No nausea, no vomiting.  No diarrhea.  No constipation. Genitourinary: Negative for dysuria. Musculoskeletal: Negative for back pain. Skin: Negative for rash. Neurological: Negative for headaches, focal weakness or numbness.  10-point ROS otherwise negative.  ____________________________________________   PHYSICAL EXAM:  VITAL SIGNS: ED Triage Vitals  Enc Vitals Group     BP 07/27/14 1507 138/72 mmHg     Pulse Rate 07/27/14 1507 95     Resp 07/27/14 1507 20     Temp 07/27/14 1507 98.6 F (37  C)     Temp Source 07/27/14 1507 Oral     SpO2 07/27/14 1507 99 %     Weight 07/27/14 1507 173 lb (78.472 kg)     Height 07/27/14 1507  (1.626 m)     Head Cir --      Peak Flow --      Pain Score --      Pain Loc --      Pain Edu? --      Excl. in GC? --     Constitutional: Alert and oriented. Well appearing and in no acute distress. Eyes: Conjunctivae are normal. PERRL. EOMI. Head: Atraumatic.Mild TTP maxillary sinuses.  Ears: nontender, no erythema. Normal TMs.  Nose: mild rhinorrhea and mucosal inflammation.  Mouth/Throat: Mucous membranes are moist.  Oropharynx non-erythematous. Neck: No stridor.  No cervical spine tenderness to palpation. Hematological/Lymphatic/Immunilogical: No cervical lymphadenopathy. Cardiovascular: Normal rate, regular rhythm. Grossly normal heart sounds.  Good peripheral circulation. Respiratory: Normal respiratory effort.  No retractions. Lungs CTAB. Gastrointestinal: Soft and nontender. No distention. No abdominal bruits. No CVA tenderness. Musculoskeletal: No lower extremity tenderness nor edema.  No joint effusions. Neurologic:  Normal speech and language. No gross focal neurologic deficits are appreciated. Speech is normal. No gait instability. Skin:  Skin is warm, dry and intact. No rash noted. Psychiatric: Mood and affect are normal. Speech and behavior are normal.  ___________________________________________   INITIAL IMPRESSION / ASSESSMENT AND PLAN / ED COURSE  Pertinent labs & imaging results that were available during my care of the patient were reviewed by me and considered in my medical decision making (see chart for details).  Well-appearing patient. No acute distress. Presents to the ER for complaints of 5-6 days of runny nose cough and congestion. Denies fever, chest pain or shortness of breath. Reports continues to eat and drink well. Rest. Fluids. Will treat sinusitis with z-pack and tessalon perrles. Follow up with PCP this  week. Pt agreed to plan. Discussed return parameters.  ____________________________________________   FINAL CLINICAL IMPRESSION(S) / ED DIAGNOSES  Final diagnoses:  Acute maxillary sinusitis, recurrence not specified  Bronchitis      Renford DillsLindsey Marigold Mom, NP 07/27/14 1723  Loleta Roseory Forbach, MD 07/28/14 [phone]

## 2014-07-27 NOTE — ED Notes (Signed)
Patient to ED with report of cold symptoms sore throat and congestion over the last few days.

## 2014-07-27 NOTE — Discharge Instructions (Signed)
Take medication as prescribed. Rest. Drink plenty of water.  Follow up with your primary care physician this week.   Return to the ER for new or worsening concerns.  Sinusitis Sinusitis is redness, soreness, and inflammation of the paranasal sinuses. Paranasal sinuses are air pockets within the bones of your face (beneath the eyes, the middle of the forehead, or above the eyes). In healthy paranasal sinuses, mucus is able to drain out, and air is able to circulate through them by way of your nose. However, when your paranasal sinuses are inflamed, mucus and air can become trapped. This can allow bacteria and other germs to grow and cause infection. Sinusitis can develop quickly and last only a short time (acute) or continue over a long period (chronic). Sinusitis that lasts for more than 12 weeks is considered chronic.  CAUSES  Causes of sinusitis include:  Allergies.  Structural abnormalities, such as displacement of the cartilage that separates your nostrils (deviated septum), which can decrease the air flow through your nose and sinuses and affect sinus drainage.  Functional abnormalities, such as when the small hairs (cilia) that line your sinuses and help remove mucus do not work properly or are not present. SIGNS AND SYMPTOMS  Symptoms of acute and chronic sinusitis are the same. The primary symptoms are pain and pressure around the affected sinuses. Other symptoms include:  Upper toothache.  Earache.  Headache.  Bad breath.  Decreased sense of smell and taste.  A cough, which worsens when you are lying flat.  Fatigue.  Fever.  Thick drainage from your nose, which often is green and may contain pus (purulent).  Swelling and warmth over the affected sinuses. DIAGNOSIS  Your health care provider will perform a physical exam. During the exam, your health care provider may:  Look in your nose for signs of abnormal growths in your nostrils (nasal polyps).  Tap over the  affected sinus to check for signs of infection.  View the inside of your sinuses (endoscopy) using an imaging device that has a light attached (endoscope). If your health care provider suspects that you have chronic sinusitis, one or more of the following tests may be recommended:  Allergy tests.  Nasal culture. A sample of mucus is taken from your nose, sent to a lab, and screened for bacteria.  Nasal cytology. A sample of mucus is taken from your nose and examined by your health care provider to determine if your sinusitis is related to an allergy. TREATMENT  Most cases of acute sinusitis are related to a viral infection and will resolve on their own within 10 days. Sometimes medicines are prescribed to help relieve symptoms (pain medicine, decongestants, nasal steroid sprays, or saline sprays).  However, for sinusitis related to a bacterial infection, your health care provider will prescribe antibiotic medicines. These are medicines that will help kill the bacteria causing the infection.  Rarely, sinusitis is caused by a fungal infection. In theses cases, your health care provider will prescribe antifungal medicine. For some cases of chronic sinusitis, surgery is needed. Generally, these are cases in which sinusitis recurs more than 3 times per year, despite other treatments. HOME CARE INSTRUCTIONS   Drink plenty of water. Water helps thin the mucus so your sinuses can drain more easily.  Use a humidifier.  Inhale steam 3 to 4 times a day (for example, sit in the bathroom with the shower running).  Apply a warm, moist washcloth to your face 3 to 4 times a day, or  as directed by your health care provider.  Use saline nasal sprays to help moisten and clean your sinuses.  Take medicines only as directed by your health care provider.  If you were prescribed either an antibiotic or antifungal medicine, finish it all even if you start to feel better. SEEK IMMEDIATE MEDICAL CARE IF:  You  have increasing pain or severe headaches.  You have nausea, vomiting, or drowsiness.  You have swelling around your face.  You have vision problems.  You have a stiff neck.  You have difficulty breathing. MAKE SURE YOU:   Understand these instructions.  Will watch your condition.  Will get help right away if you are not doing well or get worse. Document Released: 02/14/2005 Document Revised: 07/01/2013 Document Reviewed: 03/01/2011 [facility] Patient Information 2015 [place], [place]. This information is not intended to replace advice given to you by your health care provider. Make sure you discuss any questions you have with your health care provider.

## 2015-08-19 ENCOUNTER — Inpatient Hospital Stay
Admission: EM | Admit: 2015-08-19 | Discharge: 2015-08-22 | DRG: 871 | Disposition: A | Discharge status: Home / Self Care | Payer: Medicaid Other | Attending: Internal Medicine | Admitting: Internal Medicine

## 2015-08-19 ENCOUNTER — Encounter: Payer: Self-pay | Admitting: Emergency Medicine

## 2015-08-19 ENCOUNTER — Emergency Department: Payer: Medicaid Other

## 2015-08-19 ENCOUNTER — Other Ambulatory Visit: Payer: Self-pay

## 2015-08-19 DIAGNOSIS — I10 Essential (primary) hypertension: Secondary | ICD-10-CM

## 2015-08-19 DIAGNOSIS — E785 Hyperlipidemia, unspecified: Secondary | ICD-10-CM | POA: Diagnosis present

## 2015-08-19 DIAGNOSIS — J441 Chronic obstructive pulmonary disease with (acute) exacerbation: Secondary | ICD-10-CM | POA: Diagnosis present

## 2015-08-19 DIAGNOSIS — Z7982 Long term (current) use of aspirin: Secondary | ICD-10-CM

## 2015-08-19 DIAGNOSIS — F17210 Nicotine dependence, cigarettes, uncomplicated: Secondary | ICD-10-CM | POA: Diagnosis present

## 2015-08-19 DIAGNOSIS — A419 Sepsis, unspecified organism: Secondary | ICD-10-CM

## 2015-08-19 DIAGNOSIS — Z886 Allergy status to analgesic agent status: Secondary | ICD-10-CM | POA: Diagnosis not present

## 2015-08-19 DIAGNOSIS — J189 Pneumonia, unspecified organism: Secondary | ICD-10-CM | POA: Diagnosis present

## 2015-08-19 DIAGNOSIS — Z79899 Other long term (current) drug therapy: Secondary | ICD-10-CM | POA: Diagnosis not present

## 2015-08-19 DIAGNOSIS — E7800 Pure hypercholesterolemia, unspecified: Secondary | ICD-10-CM | POA: Diagnosis present

## 2015-08-19 DIAGNOSIS — A403 Sepsis due to Streptococcus pneumoniae: Principal | ICD-10-CM | POA: Diagnosis present

## 2015-08-19 DIAGNOSIS — J440 Chronic obstructive pulmonary disease with acute lower respiratory infection: Secondary | ICD-10-CM | POA: Diagnosis present

## 2015-08-19 DIAGNOSIS — E876 Hypokalemia: Secondary | ICD-10-CM | POA: Diagnosis present

## 2015-08-19 DIAGNOSIS — Z720 Tobacco use: Secondary | ICD-10-CM | POA: Diagnosis present

## 2015-08-19 HISTORY — DX: Pure hypercholesterolemia, unspecified: E78.00

## 2015-08-19 HISTORY — DX: Essential (primary) hypertension: I10

## 2015-08-19 HISTORY — DX: Unspecified asthma, uncomplicated: J45.909

## 2015-08-19 HISTORY — DX: Chronic obstructive pulmonary disease, unspecified: J44.9

## 2015-08-19 LAB — URINALYSIS COMPLETE WITH MICROSCOPIC (ARMC ONLY)
BILIRUBIN URINE: NEGATIVE
GLUCOSE, UA: NEGATIVE mg/dL
KETONES UR: NEGATIVE mg/dL
Leukocytes, UA: NEGATIVE
Nitrite: NEGATIVE
PH: 5 (ref 5.0–8.0)
Protein, ur: NEGATIVE mg/dL
Specific Gravity, Urine: 1.015 (ref 1.005–1.030)

## 2015-08-19 LAB — COMPREHENSIVE METABOLIC PANEL
ALT: 16 U/L (ref 14–54)
AST: 26 U/L (ref 15–41)
Albumin: 3.8 g/dL (ref 3.5–5.0)
Alkaline Phosphatase: 118 U/L (ref 38–126)
Anion gap: 13 (ref 5–15)
BILIRUBIN TOTAL: 0.6 mg/dL (ref 0.3–1.2)
BUN: 12 mg/dL (ref 6–20)
CO2: 24 mmol/L (ref 22–32)
CREATININE: 1.53 mg/dL — AB (ref 0.44–1.00)
Calcium: 9.5 mg/dL (ref 8.9–10.3)
Chloride: 100 mmol/L — ABNORMAL LOW (ref 101–111)
GFR calc Af Amer: 41 mL/min — ABNORMAL LOW (ref >60)
GFR, EST NON AFRICAN AMERICAN: 35 mL/min — AB (ref >60)
Glucose, Bld: 108 mg/dL — ABNORMAL HIGH (ref 65–99)
POTASSIUM: 3.8 mmol/L (ref 3.5–5.1)
Sodium: 137 mmol/L (ref 135–145)
TOTAL PROTEIN: 8.5 g/dL — AB (ref 6.5–8.1)

## 2015-08-19 LAB — CBC
HEMATOCRIT: 40.7 % (ref 35.0–47.0)
Hemoglobin: 13.5 g/dL (ref 12.0–16.0)
MCH: 26.2 pg (ref 26.0–34.0)
MCHC: 33.2 g/dL (ref 32.0–36.0)
MCV: 78.9 fL — AB (ref 80.0–100.0)
Platelets: 355 10*3/uL (ref 150–440)
RBC: 5.16 MIL/uL (ref 3.80–5.20)
RDW: 16 % — AB (ref 11.5–14.5)
WBC: 25.8 10*3/uL — AB (ref 3.6–11.0)

## 2015-08-19 LAB — TROPONIN I

## 2015-08-19 LAB — LACTIC ACID, PLASMA: LACTIC ACID, VENOUS: 2.7 mmol/L — AB (ref 0.5–2.0)

## 2015-08-19 MED ORDER — DEXTROSE 5 % IV SOLN
500.0000 mg | Freq: Once | INTRAVENOUS | Status: AC
  Administered 2015-08-19: 500 mg via INTRAVENOUS
  Filled 2015-08-19: qty 500

## 2015-08-19 MED ORDER — SODIUM CHLORIDE 0.9 % IV BOLUS (SEPSIS)
1000.0000 mL | Freq: Once | INTRAVENOUS | Status: AC
  Administered 2015-08-19: 1000 mL via INTRAVENOUS

## 2015-08-19 MED ORDER — DEXTROSE 5 % IV SOLN
1.0000 g | Freq: Once | INTRAVENOUS | Status: AC
  Administered 2015-08-19: 1 g via INTRAVENOUS
  Filled 2015-08-19: qty 10

## 2015-08-19 MED ORDER — ACETAMINOPHEN 325 MG PO TABS
650.0000 mg | ORAL_TABLET | Freq: Once | ORAL | Status: AC | PRN
  Administered 2015-08-19: 650 mg via ORAL
  Filled 2015-08-19: qty 2

## 2015-08-19 NOTE — ED Provider Notes (Addendum)
[facility] Emergency Department Provider Note   ____________________________________________  Time seen: I have reviewed the triage vital signs and the triage nursing note.  HISTORY  Chief Complaint Cough and Fever   Historian Somewhat limited as patient is a poor historian/confused   HPI Susan Jacobs is a 65 y.o. female who states that she lives at home and was not feeling well today in terms of generalized body aches and fever as well as cough. She does use inhalers for COPD. She is not really reporting shortness of breath or chest pain, but frequent cough.  No abdominal pain, she did vomit once this morning. No diarrhea. Symptoms are moderate. Generalized weakness without syncope.    Past Medical History  Diagnosis Date  . Hypertension   . Hypercholesteremia   . Asthma   . COPD (chronic obstructive pulmonary disease) (HCC)     There are no active problems to display for this patient.   History reviewed. No pertinent past surgical history.  Current Outpatient Rx  Name  Route  Sig  Dispense  Refill  . acetaminophen (TYLENOL) 500 MG tablet   Oral   Take 1,000 mg by mouth every 6 (six) hours as needed for mild pain, fever or headache.         . albuterol (PROVENTIL HFA;VENTOLIN HFA) 108 (90 Base) MCG/ACT inhaler   Inhalation   Inhale 2 puffs into the lungs every 6 (six) hours as needed for wheezing or shortness of breath.         Marland Kitchen aspirin EC 81 MG tablet   Oral   Take 81 mg by mouth daily.         . beclomethasone (QVAR) 40 MCG/ACT inhaler   Inhalation   Inhale 2 puffs into the lungs 2 (two) times daily.         . chlorpheniramine-HYDROcodone (TUSSIONEX PENNKINETIC ER) 10-8 MG/5ML SUER   Oral   Take 5 mLs by mouth every 12 (twelve) hours as needed for cough.         . Coenzyme Q10 (COQ10) 100 MG CAPS   Oral   Take 100 mg by mouth daily.         . hydrochlorothiazide (MICROZIDE) 12.5 MG capsule   Oral   Take 12.5 mg  by mouth daily.         Marland Kitchen losartan (COZAAR) 50 MG tablet   Oral   Take 50 mg by mouth daily.         Marland Kitchen omeprazole (PRILOSEC) 20 MG capsule   Oral   Take 20 mg by mouth daily.         . simvastatin (ZOCOR) 20 MG tablet   Oral   Take 20 mg by mouth at bedtime.           Allergies Aspirin  History reviewed. No pertinent family history.  Social History Social History  Substance Use Topics  . Smoking status: Heavy Tobacco Smoker  . Smokeless tobacco: None  . Alcohol Use: Yes    Review of Systems  Constitutional: Positive for fever. Eyes: Negative for visual changes. ENT: Negative for sore throat. Cardiovascular: Negative for chest pain. Respiratory: Negative for shortness of breath. Positive for cough Gastrointestinal: Negative for abdominal pain, vomiting and diarrhea. Genitourinary: Negative for dysuria. Musculoskeletal: Negative for back pain. Skin: Negative for rash. Neurological: Negative for headache. 10 point Review of Systems otherwise negative ____________________________________________   PHYSICAL EXAM:  VITAL SIGNS: ED Triage Vitals  Enc Vitals Group  BP 08/19/15 2000 125/62 mmHg     Pulse Rate 08/19/15 2000 115     Resp --      Temp 08/19/15 2000 102.5 F (39.2 C)     Temp Source 08/19/15 2000 Oral     SpO2 08/19/15 2000 96 %     Weight 08/19/15 2000 170 lb (77.111 kg)     Height 08/19/15 2000 5\' 4"  (1.626 m)     Head Cir --      Peak Flow --      Pain Score 08/19/15 2005 0     Pain Loc --      Pain Edu? --      Excl. in GC? --      Constitutional: Alert And in no acute distress, but is somewhat confused and slow to answer questions. HEENT   Head: Normocephalic and atraumatic.      Eyes: Conjunctivae are normal. PERRL. Normal extraocular movements.      Ears:         Nose: No congestion/rhinnorhea.   Mouth/Throat: Mucous membranes are moderately dry.   Neck: No stridor. Cardiovascular/Chest: Tachycardic, regular  rhythm.  No murmurs, rubs, or gallops. Respiratory: Normal respiratory effort without tachypnea nor retractions. Decreased air movement throughout, and expiratory wheezing especially with frequent cough. Gastrointestinal: Soft. No distention, no guarding, no rebound. Nontender.    Genitourinary/rectal:Deferred Musculoskeletal: Nontender with normal range of motion in all extremities. No joint effusions.  No lower extremity tenderness.  No edema. Neurologic:  No slurred speech or facial droop. Patient is somewhat slow to answer questions in a bit of a poor historian at this point. No gross or focal neurologic deficits are appreciated. Skin:  Skin is warm, dry and intact. No rash noted. Psychiatric: No hallucinations  ____________________________________________   EKG I, Governor Rooks, MD, the attending physician have personally viewed and interpreted all ECGs.  108 bpm. Sinus tachycardia. Narrow QRS. Normal axis. ST segment depressions/biphasic T-wave inferiorly and laterally  101 bpm. Sinus tachycardia. Narrow QRS. Normal axis. Nonspecific ST and T-wave ____________________________________________  LABS (pertinent positives/negatives)  Labs Reviewed  CBC - Abnormal; Notable for the following:    WBC 25.8 (*)    MCV 78.9 (*)    RDW 16.0 (*)    All other components within normal limits  COMPREHENSIVE METABOLIC PANEL - Abnormal; Notable for the following:    Chloride 100 (*)    Glucose, Bld 108 (*)    Creatinine, Ser 1.53 (*)    Total Protein 8.5 (*)    GFR calc non Af Amer 35 (*)    GFR calc Af Amer 41 (*)    All other components within normal limits  URINE CULTURE  CULTURE, BLOOD (ROUTINE X 2)  CULTURE, BLOOD (ROUTINE X 2)  TROPONIN I  LACTIC ACID, PLASMA  LACTIC ACID, PLASMA  URINALYSIS COMPLETEWITH MICROSCOPIC ([facility] ONLY)  Lactic acid 2.6  Urinalysis pending upon hospitalist consultation.  ____________________________________________  RADIOLOGY All Xrays were viewed  by me. Imaging interpreted by Radiologist.  Chest two-view:  IMPRESSION: 1. Left mid lung focal airspace opacity consistent with pneumonia. Consider follow-up to confirm appropriate resolution, exclude underlying lesion. __________________________________________  PROCEDURES  Procedure(s) performed: None  Critical Care performed: CRITICAL CARE Performed by: Governor Rooks   Total critical care time: 30 minutes  Critical care time was exclusive of separately billable procedures and treating other patients.  Critical care was necessary to treat or prevent imminent or life-threatening deterioration.  Critical care was time spent personally by  me on the following activities: development of treatment plan with patient and/or surrogate as well as nursing, discussions with consultants, evaluation of patient's response to treatment, examination of patient, obtaining history from patient or surrogate, ordering and performing treatments and interventions, ordering and review of laboratory studies, ordering and review of radiographic studies, pulse oximetry and re-evaluation of patient's condition.   ____________________________________________   ED COURSE / ASSESSMENT AND PLAN  Pertinent labs & imaging results that were available during my care of the patient were reviewed by me and considered in my medical decision making (see chart for details).    Patient arrived febrile with a complaint of body aches and cough, and her chest x-ray shows left mid lung pneumonia. She has tachycardia without any hypotension. White blood count is also significantly elevated at 25. Patient was initiated as a code sepsis. No hypotension. I am starting with 2 L normal saline bolus and would consider additional if lactate returns elevated.  Blood cultures sent before antibiotics for The acquired pneumonia started, patient was given Rocephin and azithromycin IV.  Lactate 2.7. Without hypotension and lactate  less than 4, will continue to proceed with IV fluid bolus clinically.  CONSULTATIONS:   Hospitalist for admission  Patient / Family / Caregiver informed of clinical course, medical decision-making process, and agree with plan.     ___________________________________________   FINAL CLINICAL IMPRESSION(S) / ED DIAGNOSES   Final diagnoses:  Pneumonia involving left lung, unspecified part of lung  Sepsis, due to unspecified organism [facility](HCC)              Note: This dictation was prepared with Dragon dictation. Any transcriptional errors that result from this process are unintentional   Governor Rooksebecca Sadi Arave, MD 08/19/15 [id number]  Governor Rooksebecca Sheylin Scharnhorst, MD 08/19/15 2210

## 2015-08-19 NOTE — ED Notes (Signed)
Pt states she has been seen at her PCP within the last week, started on tussin ex for cough. Pt reports in the last 2 days she has developed generalized weakness, pain at the ribs (anterior and posterior), and cough/sputum worsening. Upon assessment pt is lethargic, able to arouse by voice, and pt is able to answer questions when asked.

## 2015-08-19 NOTE — H&P (Signed)
[facility]   Chief Complaint:  Shortness of breath  HPI: This is 65 year old female who was been there for the past 3 days. She has had shortness of breath, wheeze, nonproductive cough intractable. She's had nausea and vomiting once. She has significant myalgia and has been feeling very poorly. She smokes and continues to smoke but finally stopped today. She reports fevers and chills. She finally came to ER today. In the ER her temperature is 104 and she has a white count of 25.8. She is otherwise hemodynamically stable. The hospitalist have been asked to admit. 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 Her chest x-ray is positive for pneumonia.  Review of Systems:  The patient denies anorexia, fever, weight loss,, vision loss, decreased hearing, hoarseness, chest pain, syncope, shortness of breath, peripheral edema, balance deficits, hemoptysis, abdominal pain, melena, hematochezia, severe indigestion/heartburn, hematuria, incontinence, genital sores, muscle weakness, suspicious skin lesions, transient blindness, difficulty walking, depression, unusual weight change, abnormal bleeding, enlarged lymph nodes, angioedema, and breast masses.  Past Medical History: Past Medical History  Diagnosis Date  . Hypertension   . Hypercholesteremia   . Asthma   . COPD (chronic obstructive pulmonary disease) (HCC)    History reviewed. No pertinent past surgical history.  Medications: Prior to Admission medications   Medication Sig Start Date End Date Taking? Authorizing Provider  acetaminophen (TYLENOL) 500 MG tablet Take 1,000 mg by mouth every 6 (six) hours as needed for mild pain, fever or headache.   Yes Historical Provider, MD  albuterol (PROVENTIL HFA;VENTOLIN HFA) 108 (90 Base) MCG/ACT inhaler Inhale 2 puffs into the lungs every 6 (six) hours as needed for wheezing or shortness of breath.   Yes Historical Provider, MD  aspirin EC 81 MG tablet Take 81 mg by mouth  daily.   Yes Historical Provider, MD  beclomethasone (QVAR) 40 MCG/ACT inhaler Inhale 2 puffs into the lungs 2 (two) times daily.   Yes Historical Provider, MD  chlorpheniramine-HYDROcodone (TUSSIONEX PENNKINETIC ER) 10-8 MG/5ML SUER Take 5 mLs by mouth every 12 (twelve) hours as needed for cough.   Yes Historical Provider, MD  Coenzyme Q10 (COQ10) 100 MG CAPS Take 100 mg by mouth daily.   Yes Historical Provider, MD  hydrochlorothiazide (MICROZIDE) 12.5 MG capsule Take 12.5 mg by mouth daily.   Yes Historical Provider, MD  losartan (COZAAR) 50 MG tablet Take 50 mg by mouth daily.   Yes Historical Provider, MD  omeprazole (PRILOSEC) 20 MG capsule Take 20 mg by mouth daily.   Yes Historical Provider, MD  simvastatin (ZOCOR) 20 MG tablet Take 20 mg by mouth at bedtime.   Yes Historical Provider, MD    Allergies:   Allergies  Allergen Reactions  . Aspirin Palpitations and Other (See Comments)    Pt states that she is able to use the lower dose.      Social History:  reports that she has been smoking.  She does not have any smokeless tobacco history on file. She reports that she drinks alcohol. She reports that she does not use illicit drugs.  Family History: History reviewed. No pertinent family history.  Physical Exam: Filed Vitals:   08/19/15 2052 08/19/15 2130 08/19/15 2230 08/19/15 2300  BP:  125/66 131/74 131/72  Pulse:  107 102 102  Temp: 104.8 F (40.4 C)  100.2 F (37.9 C)   TempSrc: Rectal  Oral   Resp:  21 25 34  Height:      Weight:  SpO2:  98% 99% 99%    General:  Alert and oriented times three, well developed and nourished, no acute distress Eyes: PERRLA, pink conjunctiva, no scleral icterus ENT: Moist oral mucosa, neck supple, no thyromegaly Lungs: clear to ascultation, wheeze , no crackles, no use of accessory muscles Cardiovascular: regular rate and rhythm, no regurgitation, no gallops, no murmurs. No carotid bruits, no JVD Abdomen: soft, positive BS,  non-tender, non-distended, no organomegaly, not an acute abdomen GU: not examined Neuro: CN II - XII grossly intact, sensation intact Musculoskeletal: strength 5/5 all extremities, no clubbing, cyanosis or edema Skin: no rash, no subcutaneous crepitation, no decubitus Psych: appropriate patient   Labs on Admission:   Recent Labs  08/19/15 2039  NA 137  K 3.8  CL 100*  CO2 24  GLUCOSE 108*  BUN 12  CREATININE 1.53*  CALCIUM 9.5    Recent Labs  08/19/15 2039  AST 26  ALT 16  ALKPHOS 118  BILITOT 0.6  PROT 8.5*  ALBUMIN 3.8   No results for input(s): LIPASE, AMYLASE in the last 72 hours.  Recent Labs  08/19/15 2039  WBC 25.8*  HGB 13.5  HCT 40.7  MCV 78.9*  PLT 355    Recent Labs  08/19/15 2039  TROPONINI <0.03   Invalid input(s): POCBNP No results for input(s): DDIMER in the last 72 hours. No results for input(s): HGBA1C in the last 72 hours. No results for input(s): CHOL, HDL, LDLCALC, TRIG, CHOLHDL, LDLDIRECT in the last 72 hours. No results for input(s): TSH, T4TOTAL, T3FREE, THYROIDAB in the last 72 hours.  Invalid input(s): FREET3 No results for input(s): VITAMINB12, FOLATE, FERRITIN, TIBC, IRON, RETICCTPCT in the last 72 hours.  Micro Results: No results found for this or any previous visit (from the past 240 hour(s)).   Radiological Exams on Admission: Dg Chest 2 View  08/19/2015  CLINICAL DATA:  cough and fever. Pt reports that she has been experienced these symptoms three weeks ago and they went away but came back today. Pt reports generalized malaise and fever at home controlled by taking Tylenol. H/o copd, asthma, and htn. EXAM: CHEST - 2 VIEW COMPARISON:  06/05/2012 FINDINGS: Focal poorly marginated airspace opacity in the left mid lung, new since previous. Right lung clear. Linear scarring or subsegmental atelectasis in the inferior lingula. Heart size and mediastinal contours are within normal limits. Small hiatal hernia. No effusion.  Visualized bones unremarkable. IMPRESSION: 1. Left mid lung focal airspace opacity consistent with pneumonia. Consider follow-up to confirm appropriate resolution, exclude underlying lesion. Electronically Signed   By: Corlis Leak  Hassell M.D.   On: 08/19/2015 20:32    Assessment/Plan Present on Admission:  . Community acquired pneumonia -Admit to MedSurg -Blood cultures and urine cultures collected -IV antibiotics with Rocephin and azithromycin ordered -Oxygen to keep sats greater than 88% -Douonebs as needed  . Tobacco abuse -Nicotine patch ordered  . acute exacerbation of COPD -Solu-Medrol IV every 8 hours  Hypertension -Stable, home medications resumed    Sagrario Lineberry 08/19/2015, 11:23 PM

## 2015-08-19 NOTE — ED Notes (Signed)
Pt arrived to the ED accompanied by her husband for complaints of cough and fever. Pt reports that she has been experienced these symptoms three weeks ago and they went away but came back today. Pt reports generalized malaise and fever at home controlled by taking Tylenol. Pt is AOx4 in no apparent distress.

## 2015-08-20 LAB — CBC
HCT: 33 % — ABNORMAL LOW (ref 35.0–47.0)
Hemoglobin: 11 g/dL — ABNORMAL LOW (ref 12.0–16.0)
MCH: 26.2 pg (ref 26.0–34.0)
MCHC: 33.3 g/dL (ref 32.0–36.0)
MCV: 78.5 fL — ABNORMAL LOW (ref 80.0–100.0)
PLATELETS: 287 10*3/uL (ref 150–440)
RBC: 4.2 MIL/uL (ref 3.80–5.20)
RDW: 15.2 % — AB (ref 11.5–14.5)
WBC: 26.9 10*3/uL — AB (ref 3.6–11.0)

## 2015-08-20 LAB — BLOOD CULTURE ID PANEL (REFLEXED)
ACINETOBACTER BAUMANNII: NOT DETECTED
CANDIDA KRUSEI: NOT DETECTED
CARBAPENEM RESISTANCE: NOT DETECTED
Candida albicans: NOT DETECTED
Candida glabrata: NOT DETECTED
Candida parapsilosis: NOT DETECTED
Candida tropicalis: NOT DETECTED
ENTEROBACTERIACEAE SPECIES: NOT DETECTED
ENTEROCOCCUS SPECIES: NOT DETECTED
Enterobacter cloacae complex: NOT DETECTED
Escherichia coli: NOT DETECTED
HAEMOPHILUS INFLUENZAE: NOT DETECTED
KLEBSIELLA OXYTOCA: NOT DETECTED
Klebsiella pneumoniae: NOT DETECTED
LISTERIA MONOCYTOGENES: NOT DETECTED
Methicillin resistance: NOT DETECTED
NEISSERIA MENINGITIDIS: NOT DETECTED
PSEUDOMONAS AERUGINOSA: NOT DETECTED
Proteus species: NOT DETECTED
SERRATIA MARCESCENS: NOT DETECTED
STAPHYLOCOCCUS AUREUS BCID: NOT DETECTED
STAPHYLOCOCCUS SPECIES: NOT DETECTED
STREPTOCOCCUS PYOGENES: NOT DETECTED
STREPTOCOCCUS SPECIES: DETECTED — AB
Streptococcus agalactiae: NOT DETECTED
Streptococcus pneumoniae: DETECTED — AB
VANCOMYCIN RESISTANCE: NOT DETECTED

## 2015-08-20 LAB — BASIC METABOLIC PANEL
Anion gap: 9 (ref 5–15)
BUN: 12 mg/dL (ref 6–20)
CHLORIDE: 107 mmol/L (ref 101–111)
CO2: 22 mmol/L (ref 22–32)
CREATININE: 1.32 mg/dL — AB (ref 0.44–1.00)
Calcium: 8.1 mg/dL — ABNORMAL LOW (ref 8.9–10.3)
GFR, EST AFRICAN AMERICAN: 49 mL/min — AB (ref >60)
GFR, EST NON AFRICAN AMERICAN: 42 mL/min — AB (ref >60)
Glucose, Bld: 97 mg/dL (ref 65–99)
Potassium: 3 mmol/L — ABNORMAL LOW (ref 3.5–5.1)
Sodium: 138 mmol/L (ref 135–145)

## 2015-08-20 LAB — LACTIC ACID, PLASMA: Lactic Acid, Venous: 2 mmol/L (ref 0.5–2.0)

## 2015-08-20 MED ORDER — POTASSIUM CHLORIDE CRYS ER 20 MEQ PO TBCR
40.0000 meq | EXTENDED_RELEASE_TABLET | Freq: Once | ORAL | Status: AC
  Administered 2015-08-20: 40 meq via ORAL
  Filled 2015-08-20: qty 2

## 2015-08-20 MED ORDER — PROMETHAZINE HCL 25 MG PO TABS: 12.5000 mg | ORAL_TABLET | Freq: Four times a day (QID) | ORAL | Status: DC | PRN

## 2015-08-20 MED ORDER — DEXTROSE 5 % IV SOLN: 1.0000 g | INTRAVENOUS | Status: DC

## 2015-08-20 MED ORDER — ENOXAPARIN SODIUM 40 MG/0.4ML ~~LOC~~ SOLN
40.0000 mg | Freq: Every day | SUBCUTANEOUS | Status: DC
  Administered 2015-08-20 – 2015-08-21 (×3): 40 mg via SUBCUTANEOUS
  Filled 2015-08-20 (×3): qty 0.4

## 2015-08-20 MED ORDER — IPRATROPIUM-ALBUTEROL 0.5-2.5 (3) MG/3ML IN SOLN
3.0000 mL | RESPIRATORY_TRACT | Status: DC | PRN
  Administered 2015-08-20: 3 mL via RESPIRATORY_TRACT
  Filled 2015-08-20: qty 3

## 2015-08-20 MED ORDER — LEVOFLOXACIN IN D5W 750 MG/150ML IV SOLN
750.0000 mg | INTRAVENOUS | Status: DC
  Filled 2015-08-20: qty 150

## 2015-08-20 MED ORDER — ACETAMINOPHEN 650 MG RE SUPP: 650.0000 mg | Freq: Four times a day (QID) | RECTAL | Status: DC | PRN

## 2015-08-20 MED ORDER — HYDROCHLOROTHIAZIDE 12.5 MG PO CAPS
12.5000 mg | ORAL_CAPSULE | Freq: Every day | ORAL | Status: DC
  Administered 2015-08-20 – 2015-08-22 (×3): 12.5 mg via ORAL
  Filled 2015-08-20 (×3): qty 1

## 2015-08-20 MED ORDER — SIMVASTATIN 20 MG PO TABS
20.0000 mg | ORAL_TABLET | Freq: Every day | ORAL | Status: DC
  Administered 2015-08-20 – 2015-08-21 (×2): 20 mg via ORAL
  Filled 2015-08-20 (×2): qty 1

## 2015-08-20 MED ORDER — POTASSIUM CHLORIDE CRYS ER 20 MEQ PO TBCR
20.0000 meq | EXTENDED_RELEASE_TABLET | Freq: Once | ORAL | Status: AC
  Administered 2015-08-20: 20 meq via ORAL
  Filled 2015-08-20: qty 1

## 2015-08-20 MED ORDER — ACETAMINOPHEN 325 MG PO TABS: 650.0000 mg | ORAL_TABLET | Freq: Four times a day (QID) | ORAL | Status: DC | PRN

## 2015-08-20 MED ORDER — NICOTINE 14 MG/24HR TD PT24
14.0000 mg | MEDICATED_PATCH | Freq: Every day | TRANSDERMAL | Status: DC
  Filled 2015-08-20 (×2): qty 1

## 2015-08-20 MED ORDER — LOSARTAN POTASSIUM 50 MG PO TABS
50.0000 mg | ORAL_TABLET | Freq: Every day | ORAL | Status: DC
  Administered 2015-08-20 – 2015-08-22 (×3): 50 mg via ORAL
  Filled 2015-08-20 (×3): qty 1

## 2015-08-20 MED ORDER — ASPIRIN EC 81 MG PO TBEC
81.0000 mg | DELAYED_RELEASE_TABLET | Freq: Every day | ORAL | Status: DC
  Administered 2015-08-20 – 2015-08-22 (×3): 81 mg via ORAL
  Filled 2015-08-20 (×4): qty 1

## 2015-08-20 MED ORDER — AZITHROMYCIN 500 MG IV SOLR: 500.0000 mg | INTRAVENOUS | Status: DC

## 2015-08-20 MED ORDER — DOCUSATE SODIUM 100 MG PO CAPS
100.0000 mg | ORAL_CAPSULE | Freq: Two times a day (BID) | ORAL | Status: DC
  Administered 2015-08-20 – 2015-08-22 (×3): 100 mg via ORAL
  Filled 2015-08-20 (×5): qty 1

## 2015-08-20 MED ORDER — PANTOPRAZOLE SODIUM 40 MG PO TBEC
40.0000 mg | DELAYED_RELEASE_TABLET | Freq: Every day | ORAL | Status: DC
  Administered 2015-08-20 – 2015-08-22 (×3): 40 mg via ORAL
  Filled 2015-08-20 (×3): qty 1

## 2015-08-20 MED ORDER — DEXTROSE 5 % IV SOLN
2.0000 g | INTRAVENOUS | Status: DC
  Administered 2015-08-20 – 2015-08-21 (×2): 2 g via INTRAVENOUS
  Filled 2015-08-20 (×3): qty 2

## 2015-08-20 MED ORDER — METHYLPREDNISOLONE SODIUM SUCC 125 MG IJ SOLR
60.0000 mg | Freq: Three times a day (TID) | INTRAMUSCULAR | Status: DC
  Administered 2015-08-20 – 2015-08-21 (×6): 60 mg via INTRAVENOUS
  Filled 2015-08-20 (×6): qty 2

## 2015-08-20 NOTE — Progress Notes (Signed)
[facility] -  at [facility]   PATIENT NAME: Susan MiresLizzie Jacobs    MR#:  [id number]  DATE OF BIRTH:  [birth year]  SUBJECTIVE:  CHIEF COMPLAINT:  pts sob is better, tired   REVIEW OF SYSTEMS:  CONSTITUTIONAL: No fever, fatigue or weakness.  EYES: No blurred or double vision.  EARS, NOSE, AND THROAT: No tinnitus or ear pain.  RESPIRATORY: some cough, shortness of breath is better wheezing or hemoptysis.  CARDIOVASCULAR: No chest pain, orthopnea, edema.  GASTROINTESTINAL: No nausea, vomiting, diarrhea or abdominal pain.  GENITOURINARY: No dysuria, hematuria.  ENDOCRINE: No polyuria, nocturia,  HEMATOLOGY: No anemia, easy bruising or bleeding SKIN: No rash or lesion. MUSCULOSKELETAL: No joint pain or arthritis.   NEUROLOGIC: No tingling, numbness, weakness.  PSYCHIATRY: No anxiety or depression.   DRUG ALLERGIES:   Allergies  Allergen Reactions  . Aspirin Palpitations and Other (See Comments)    Pt states that she is able to use the lower dose.      VITALS:  Blood pressure 106/55, pulse 95, temperature 98.2 F (36.8 C), temperature source Oral, resp. rate 18, height 5\' 4"  (1.626 m), weight 80.105 kg (176 lb 9.6 oz), SpO2 97 %.  PHYSICAL EXAMINATION:  GENERAL:  65 y.o.-year-old patient lying in the bed with no acute distress.  EYES: Pupils equal, round, reactive to light and accommodation. No scleral icterus. Extraocular muscles intact.  HEENT: Head atraumatic, normocephalic. Oropharynx and nasopharynx clear.  NECK:  Supple, no jugular venous distention. No thyroid enlargement, no tenderness.  LUNGS: moderate breath sounds bilaterally, minimal wheezing, rales,rhonchi or crepitation. No use of accessory muscles of respiration.  CARDIOVASCULAR: S1, S2 normal. No murmurs, rubs, or gallops.  ABDOMEN: Soft, nontender, nondistended. Bowel sounds present. No organomegaly or mass.  EXTREMITIES: No pedal edema, cyanosis, or clubbing.  NEUROLOGIC: Cranial  nerves II through XII are intact. Muscle strength 5/5 in all extremities. Sensation intact. Gait not checked.  PSYCHIATRIC: The patient is alert and oriented x 3.  SKIN: No obvious rash, lesion, or ulcer.    LABORATORY PANEL:   CBC  Recent Labs Lab 08/20/15 0130  WBC 26.9*  HGB 11.0*  HCT 33.0*  PLT 287   ------------------------------------------------------------------------------------------------------------------  Chemistries   Recent Labs Lab 08/19/15 2039 08/20/15 0130  NA 137 138  K 3.8 3.0*  CL 100* 107  CO2 24 22  GLUCOSE 108* 97  BUN 12 12  CREATININE 1.53* 1.32*  CALCIUM 9.5 8.1*  AST 26  --   ALT 16  --   ALKPHOS 118  --   BILITOT 0.6  --    ------------------------------------------------------------------------------------------------------------------  Cardiac Enzymes  Recent Labs Lab 08/19/15 2039  TROPONINI <0.03   ------------------------------------------------------------------------------------------------------------------  RADIOLOGY:  Dg Chest 2 View  08/19/2015  CLINICAL DATA:  cough and fever. Pt reports that she has been experienced these symptoms three weeks ago and they went away but came back today. Pt reports generalized malaise and fever at home controlled by taking Tylenol. H/o copd, asthma, and htn. EXAM: CHEST - 2 VIEW COMPARISON:  06/05/2012 FINDINGS: Focal poorly marginated airspace opacity in the left mid lung, new since previous. Right lung clear. Linear scarring or subsegmental atelectasis in the inferior lingula. Heart size and mediastinal contours are within normal limits. Small hiatal hernia. No effusion. Visualized bones unremarkable. IMPRESSION: 1. Left mid lung focal airspace opacity consistent with pneumonia. Consider follow-up to confirm appropriate resolution, exclude underlying lesion. Electronically Signed   By: Corlis Leak  Hassell M.D.   On: 08/19/2015  20:32    EKG:   Orders placed or performed during the hospital  encounter of 08/19/15  . EKG test  . EKG test    ASSESSMENT AND PLAN:   . Community acquired pneumonia -clinically better than yesterday -Blood cultures and urine cultures pending -IV antibiotics with Rocephin and azithromycin will be continued -Oxygen to keep sats greater than 88% -Douonebs as needed  . acute exacerbation of COPD -Solu-Medrol IV every 8 hours Nebs and abx   . Tobacco abuse -counselled pt to quit smoking for 3-5 min -Nicotine patch provided  . Hypokalemia Replete and check bmp  Hypertension -Stable, home medications resumed       All the records are reviewed and case discussed with Care Management/Social Workerr. Management plans discussed with the patient, family and they are in agreement.  CODE STATUS: fc  TOTAL TIME TAKING CARE OF THIS PATIENT: 36  minutes.   POSSIBLE D/C IN  DAYS, DEPENDING ON CLINICAL CONDITION.  Note: This dictation was prepared with Dragon dictation along with smaller phrase technology. Any transcriptional errors that result from this process are unintentional.   Ramonita LabGouru, Fedrick Cefalu M.D on 08/20/2015 at 8:15 PM  Between 7am to 6pm - Pager - [phone] After 6pm go to [email or web address] - password [email or web address] [facility]  [facility]  Office  [phone]  CC: Primary care physician; [facility]AL [facility] CLINIC

## 2015-08-20 NOTE — Plan of Care (Signed)
Problem: Fluid Volume: Goal: Hemodynamic stability will improve Outcome: Progressing Patient VS is stable.

## 2015-08-20 NOTE — Progress Notes (Signed)
PHARMACY - PHYSICIAN COMMUNICATION CRITICAL VALUE ALERT - BLOOD CULTURE IDENTIFICATION (BCID)  Results for orders placed or performed during the hospital encounter of 08/19/15  Blood Culture ID Panel (Reflexed) (Collected: 08/19/2015  8:44 PM)  Result Value Ref Range   Enterococcus species NOT DETECTED NOT DETECTED   Vancomycin resistance NOT DETECTED NOT DETECTED   Listeria monocytogenes NOT DETECTED NOT DETECTED   Staphylococcus species NOT DETECTED NOT DETECTED   Staphylococcus aureus NOT DETECTED NOT DETECTED   Methicillin resistance NOT DETECTED NOT DETECTED   Streptococcus species DETECTED (A) NOT DETECTED   Streptococcus agalactiae NOT DETECTED NOT DETECTED   Streptococcus pneumoniae DETECTED (A) NOT DETECTED   Streptococcus pyogenes NOT DETECTED NOT DETECTED   Acinetobacter baumannii NOT DETECTED NOT DETECTED   Enterobacteriaceae species NOT DETECTED NOT DETECTED   Enterobacter cloacae complex NOT DETECTED NOT DETECTED   Escherichia coli NOT DETECTED NOT DETECTED   Klebsiella oxytoca NOT DETECTED NOT DETECTED   Klebsiella pneumoniae NOT DETECTED NOT DETECTED   Proteus species NOT DETECTED NOT DETECTED   Serratia marcescens NOT DETECTED NOT DETECTED   Carbapenem resistance NOT DETECTED NOT DETECTED   Haemophilus influenzae NOT DETECTED NOT DETECTED   Neisseria meningitidis NOT DETECTED NOT DETECTED   Pseudomonas aeruginosa NOT DETECTED NOT DETECTED   Candida albicans NOT DETECTED NOT DETECTED   Candida glabrata NOT DETECTED NOT DETECTED   Candida krusei NOT DETECTED NOT DETECTED   Candida parapsilosis NOT DETECTED NOT DETECTED   Candida tropicalis NOT DETECTED NOT DETECTED    Name of physician (or Provider) Contacted: Dr Amado CoeGouru   Changes to prescribed antibiotics required: Yes  Pt growing GPC in 1 of 4 aerobic bottles, BCID indicates Strep Pneumo.   Pt previously on levaquin 750 mg IV Q48H ; will change to Ceftriaxone 2 gm IV Q24H to start 6/22 @ 16:00.      Finnley Larusso D 08/20/2015  3:48 PM

## 2015-08-20 NOTE — Plan of Care (Signed)
Problem: Education: Goal: Knowledge of [facility] Education information/materials will improve Outcome: Completed/Met Date Met:  08/20/15 Handout at the bedside.

## 2015-08-20 NOTE — Plan of Care (Signed)
Problem: Physical Regulation: Goal: Diagnostic test results will improve Outcome: Progressing Lactic acid is elevated Goal: Signs and symptoms of infection will decrease Outcome: Progressing Patient temp is 100.1, will continue to monitor.

## 2015-08-21 LAB — BASIC METABOLIC PANEL
ANION GAP: 7 (ref 5–15)
BUN: 21 mg/dL — ABNORMAL HIGH (ref 6–20)
CALCIUM: 8.8 mg/dL — AB (ref 8.9–10.3)
CHLORIDE: 106 mmol/L (ref 101–111)
CO2: 23 mmol/L (ref 22–32)
Creatinine, Ser: 1.19 mg/dL — ABNORMAL HIGH (ref 0.44–1.00)
GFR calc non Af Amer: 48 mL/min — ABNORMAL LOW (ref >60)
GFR, EST AFRICAN AMERICAN: 56 mL/min — AB (ref >60)
GLUCOSE: 129 mg/dL — AB (ref 65–99)
Potassium: 3.7 mmol/L (ref 3.5–5.1)
Sodium: 136 mmol/L (ref 135–145)

## 2015-08-21 LAB — URINE CULTURE

## 2015-08-21 LAB — MAGNESIUM: Magnesium: 2.2 mg/dL (ref 1.7–2.4)

## 2015-08-21 MED ORDER — METHYLPREDNISOLONE SODIUM SUCC 40 MG IJ SOLR
40.0000 mg | Freq: Two times a day (BID) | INTRAMUSCULAR | Status: DC
  Administered 2015-08-22: 40 mg via INTRAVENOUS
  Filled 2015-08-21: qty 1

## 2015-08-21 NOTE — Progress Notes (Signed)
[facility] - [place] at [facility]   PATIENT NAME: Susan Jacobs    MR#:  [id number]  DATE OF BIRTH:  [birth year]  SUBJECTIVE:  CHIEF COMPLAINT:  pts feeling better, some cough  REVIEW OF SYSTEMS:  CONSTITUTIONAL: No fever, fatigue or weakness.  EYES: No blurred or double vision.  EARS, NOSE, AND THROAT: No tinnitus or ear pain.  RESPIRATORY: some cough, shortness of breath is better wheezing or hemoptysis.  CARDIOVASCULAR: No chest pain, orthopnea, edema.  GASTROINTESTINAL: No nausea, vomiting, diarrhea or abdominal pain.  GENITOURINARY: No dysuria, hematuria.  ENDOCRINE: No polyuria, nocturia,  HEMATOLOGY: No anemia, easy bruising or bleeding SKIN: No rash or lesion. MUSCULOSKELETAL: No joint pain or arthritis.   NEUROLOGIC: No tingling, numbness, weakness.  PSYCHIATRY: No anxiety or depression.   DRUG ALLERGIES:   Allergies  Allergen Reactions  . Aspirin Palpitations and Other (See Comments)    Pt states that she is able to use the lower dose.      VITALS:  Blood pressure 129/71, pulse 78, temperature 98.3 F (36.8 C), temperature source Oral, resp. rate 18, height 5\' 4"  (1.626 m), weight 80.105 kg (176 lb 9.6 oz), SpO2 98 %.  PHYSICAL EXAMINATION:  GENERAL:  65 y.o.-year-old patient lying in the bed with no acute distress.  EYES: Pupils equal, round, reactive to light and accommodation. No scleral icterus. Extraocular muscles intact.  HEENT: Head atraumatic, normocephalic. Oropharynx and nasopharynx clear.  NECK:  Supple, no jugular venous distention. No thyroid enlargement, no tenderness.  LUNGS: moderate breath sounds bilaterally, minimal wheezing, rales,rhonchi or crepitation. No use of accessory muscles of respiration.  CARDIOVASCULAR: S1, S2 normal. No murmurs, rubs, or gallops.  ABDOMEN: Soft, nontender, nondistended. Bowel sounds present. No organomegaly or mass.  EXTREMITIES: No pedal edema, cyanosis, or clubbing.  NEUROLOGIC: Cranial  nerves II through XII are intact. Muscle strength 5/5 in all extremities. Sensation intact. Gait not checked.  PSYCHIATRIC: The patient is alert and oriented x 3.  SKIN: No obvious rash, lesion, or ulcer.    LABORATORY PANEL:   CBC  Recent Labs Lab 08/20/15 0130  WBC 26.9*  HGB 11.0*  HCT 33.0*  PLT 287   ------------------------------------------------------------------------------------------------------------------  Chemistries   Recent Labs Lab 08/19/15 2039  08/21/15 0448  NA 137  < > 136  K 3.8  < > 3.7  CL 100*  < > 106  CO2 24  < > 23  GLUCOSE 108*  < > 129*  BUN 12  < > 21*  CREATININE 1.53*  < > 1.19*  CALCIUM 9.5  < > 8.8*  MG  --   --  2.2  AST 26  --   --   ALT 16  --   --   ALKPHOS 118  --   --   BILITOT 0.6  --   --   < > = values in this interval not displayed. ------------------------------------------------------------------------------------------------------------------  Cardiac Enzymes  Recent Labs Lab 08/19/15 2039  TROPONINI <0.03   ------------------------------------------------------------------------------------------------------------------  RADIOLOGY:  No results found.  EKG:   Orders placed or performed during the hospital encounter of 08/19/15  . EKG test  . EKG test    ASSESSMENT AND PLAN:   . Community acquired pneumonia -clinically better than yesterday -Blood cultures with strep pneumo -urine cx contaminated -IV antibiotics with Rocephin and d/ced azithromycin  -Oxygen to keep sats greater than 88% -Douonebs as needed  . acute exacerbation of COPD -Solu-Medrol IV every 8 hours tapered to 40  mg  12 hrs Nebs and abx   . Tobacco abuse -counselled pt to quit smoking for 3-5 min -Nicotine patch provided  . Hypokalemia Repleted and k -3.7 today  Hypertension -Stable, home medications resumed       All the records are reviewed and case discussed with Care Management/Social Workerr. Management plans  discussed with the patient, family and they are in agreement.  CODE STATUS: fc  TOTAL TIME TAKING CARE OF THIS PATIENT: 36  minutes.   POSSIBLE D/C IN am DAYS, DEPENDING ON CLINICAL CONDITION.  Note: This dictation was prepared with Dragon dictation along with smaller phrase technology. Any transcriptional errors that result from this process are unintentional.   Ramonita LabGouru, Autym Siess M.D on 08/21/2015 at 9:01 PM  Between 7am to 6pm - Pager - [phone] After 6pm go to [email or web address] - password [email or web address] [email or web address]  [facility]  Office  [phone]  CC: Primary care physician; GENERAL MEDICAL CLINIC

## 2015-08-22 LAB — CULTURE, BLOOD (ROUTINE X 2)

## 2015-08-22 MED ORDER — AZITHROMYCIN 250 MG PO TABS
250.0000 mg | ORAL_TABLET | Freq: Every day | ORAL | Status: DC
  Administered 2015-08-22: 250 mg via ORAL
  Filled 2015-08-22: qty 1

## 2015-08-22 MED ORDER — NICOTINE 14 MG/24HR TD PT24: 14.0000 mg | MEDICATED_PATCH | Freq: Every day | TRANSDERMAL | Status: DC

## 2015-08-22 MED ORDER — CEFUROXIME AXETIL 500 MG PO TABS: 500.0000 mg | ORAL_TABLET | Freq: Two times a day (BID) | ORAL | Status: DC

## 2015-08-22 MED ORDER — PREDNISONE 5 MG PO TABS: ORAL_TABLET | ORAL | Status: DC

## 2015-08-22 MED ORDER — AZITHROMYCIN 250 MG PO TABS: ORAL_TABLET | ORAL | Status: DC

## 2015-08-22 NOTE — Discharge Instructions (Signed)
Sepsis, Adult Sepsis is a serious infection of your blood or tissues that affects your whole body. The infection that causes sepsis may be bacterial, viral, fungal, or parasitic. Sepsis may be life threatening. Sepsis can cause your blood pressure to drop. This may result in shock. Shock causes your central nervous system and your organs to stop working correctly.  RISK FACTORS Sepsis can happen in anyone, but it is more likely to happen in people who have weakened immune systems. SIGNS AND SYMPTOMS  Symptoms of sepsis can include:  Fever or low body temperature (hypothermia).  Rapid breathing (hyperventilation).  Chills.  Rapid heartbeat (tachycardia).  Confusion or light-headedness.  Trouble breathing.  Urinating much less than usual.  Cool, clammy skin or red, flushed skin.  Other problems with the heart, kidneys, or brain. DIAGNOSIS  Your health care provider will likely do tests to look for an infection, to see if the infection has spread to your blood, and to see how serious your condition is. Tests can include:  Blood tests, including cultures of your blood.  Cultures of other fluids from your body, such as:  Urine.  Pus from wounds.  Mucus coughed up from your lungs.  Urine tests other than cultures.  X-ray exams or other imaging tests. TREATMENT  Treatment will begin with elimination of the source of infection. If your sepsis is likely caused by a bacterial or fungal infection, you will be given antibiotic or antifungal medicines. You may also receive:  Oxygen.  Fluids through an IV tube.  Medicines to increase your blood pressure.  A machine to clean your blood (dialysis) if your kidneys fail.  A machine to help you breathe if your lungs fail. SEEK IMMEDIATE MEDICAL CARE IF: You get an infection or develop any of the signs and symptoms of sepsis after surgery or a hospitalization.   This information is not intended to replace advice given to you by  your health care provider. Make sure you discuss any questions you have with your health care provider.   Document Released: 11/13/2002 Document Revised: 07/01/2014 Document Reviewed: 10/22/2012 Elsevier Interactive Patient Education 2016 [employer] Inc.  Community-Acquired Pneumonia, Adult Pneumonia is an infection of the lungs. One type of pneumonia can happen while a person is in a hospital. A different type can happen when a person is not in a hospital (community-acquired pneumonia). It is easy for this kind to spread from person to person. It can spread to you if you breathe near an infected person who coughs or sneezes. Some symptoms include:  A dry cough.  A wet (productive) cough.  Fever.  Sweating.  Chest pain. HOME CARE  Take over-the-counter and prescription medicines only as told by your doctor.  Only take cough medicine if you are losing sleep.  If you were prescribed an antibiotic medicine, take it as told by your doctor. Do not stop taking the antibiotic even if you start to feel better.  Sleep with your head and neck raised (elevated). You can do this by putting a few pillows under your head, or you can sleep in a recliner.  Do not use tobacco products. These include cigarettes, chewing tobacco, and e-cigarettes. If you need help quitting, ask your doctor.  Drink enough water to keep your pee (urine) clear or pale yellow. A shot (vaccine) can help prevent pneumonia. Shots are often suggested for:  People older than 63 years of age.  People older than 63 years of age:  Who are having cancer treatment.  Who have long-term (chronic) lung disease.  Who have problems with their body's defense system (immune system). You may also prevent pneumonia if you take these actions:  Get the flu (influenza) shot every year.  Go to the dentist as often as told.  Wash your hands often. If soap and water are not available, use hand sanitizer. GET HELP IF:  You have a  fever.  You lose sleep because your cough medicine does not help. GET HELP RIGHT AWAY IF:  You are short of breath and it gets worse.  You have more chest pain.  Your sickness gets worse. This is very serious if:  You are an older adult.  Your body's defense system is weak.  You cough up blood.   This information is not intended to replace advice given to you by your health care provider. Make sure you discuss any questions you have with your health care provider.   Document Released: 08/03/2007 Document Revised: 11/05/2014 Document Reviewed: 06/11/2014 Elsevier Interactive Patient Education [employer]

## 2015-08-22 NOTE — Discharge Summary (Signed)
[employer] [facility] [place] [facility]   PATIENT NAME: Susan Jacobs    MR#:  [id number]  DATE OF BIRTH:  [birth year]  DATE OF ADMISSION:  08/19/2015 ADMITTING PHYSICIAN: Gery Pray, MD  DATE OF DISCHARGE: 08/22/2015  1:53 PM  PRIMARY CARE PHYSICIAN: [facility]    ADMISSION DIAGNOSIS:  Sepsis, due to unspecified organism (HCC) [A41.9] Pneumonia involving left lung, unspecified part of lung [J18.9]  DISCHARGE DIAGNOSIS:  Principal Problem:   Community acquired pneumonia Active Problems:   HTN (hypertension)   COPD (chronic obstructive pulmonary disease) (HCC)   Dyslipidemia   Tobacco abuse   Pneumonia   SECONDARY DIAGNOSIS:   Past Medical History  Diagnosis Date  . Hypertension   . Hypercholesteremia   . Asthma   . COPD (chronic obstructive pulmonary disease) (HCC)     HOSPITAL COURSE:   1. Sepsis with Streptococcus pneumoniae present on admission,  community-acquired pneumonia. Rocephin and Zithromax given during the hospital course. Patient feeling much better today. And once to go home. I will send her home on Ceftin and Zithromax for completion of course. Since blood cultures are positive it will be a total of 2 weeks of Ceftin from the start of Rocephin. Unfortunately the patient's white blood cell count was not repeated. I'm sure it has improved since the patient is clinically better. 2. COPD exacerbation. Quick prednisone taper given upon discharge. Lungs were clear upon discharge. Patient was on Solu-Medrol during the hospital course. 3. Tobacco abuse patient must stop smoking. 4. Essential hypertension on medication 5. Hyperlipidemia unspecified on simvastatin  DISCHARGE CONDITIONS:   Satisfactory  CONSULTS OBTAINED:  None  DRUG ALLERGIES:   Allergies  Allergen Reactions  . Aspirin Palpitations and Other (See Comments)    Pt states that she is able to use the lower dose.      DISCHARGE MEDICATIONS:   Discharge  Medication List as of 08/22/2015  1:31 PM    START taking these medications   Details  azithromycin (ZITHROMAX) 250 MG tablet One tablet daily for three more days, Print    cefUROXime (CEFTIN) 500 MG tablet Take 1 tablet (500 mg total) by mouth 2 (two) times daily with a meal., Starting 08/22/2015, Until Discontinued, Print    nicotine (NICODERM CQ - DOSED IN MG/24 HOURS) 14 mg/24hr patch Place 1 patch (14 mg total) onto the skin daily., Starting 08/22/2015, Until Discontinued, Print    predniSONE (DELTASONE) 5 MG tablet 4 tabs po day1; 3 tabs po day2; 2 tabs po day3; 1 tab po day4, Print      CONTINUE these medications which have NOT CHANGED   Details  acetaminophen (TYLENOL) 500 MG tablet Take 1,000 mg by mouth every 6 (six) hours as needed for mild pain, fever or headache., Until Discontinued, Historical Med    albuterol (PROVENTIL HFA;VENTOLIN HFA) 108 (90 Base) MCG/ACT inhaler Inhale 2 puffs into the lungs every 6 (six) hours as needed for wheezing or shortness of breath., Until Discontinued, Historical Med    aspirin EC 81 MG tablet Take 81 mg by mouth daily., Until Discontinued, Historical Med    beclomethasone (QVAR) 40 MCG/ACT inhaler Inhale 2 puffs into the lungs 2 (two) times daily., Until Discontinued, Historical Med    chlorpheniramine-HYDROcodone (TUSSIONEX PENNKINETIC ER) 10-8 MG/5ML SUER Take 5 mLs by mouth every 12 (twelve) hours as needed for cough., Until Discontinued, Historical Med    Coenzyme Q10 (COQ10) 100 MG CAPS Take 100 mg by mouth daily., Until Discontinued, Historical Med  hydrochlorothiazide (MICROZIDE) 12.5 MG capsule Take 12.5 mg by mouth daily., Until Discontinued, Historical Med    losartan (COZAAR) 50 MG tablet Take 50 mg by mouth daily., Until Discontinued, Historical Med    omeprazole (PRILOSEC) 20 MG capsule Take 20 mg by mouth daily., Until Discontinued, Historical Med    simvastatin (ZOCOR) 20 MG tablet Take 20 mg by mouth at bedtime., Until  Discontinued, Historical Med         DISCHARGE INSTRUCTIONS:   Follow-up PMD one week  If you experience worsening of your admission symptoms, develop shortness of breath, life threatening emergency, suicidal or homicidal thoughts you must seek medical attention immediately by calling 911 or calling your MD immediately  if symptoms less severe.  You Must read complete instructions/literature along with all the possible adverse reactions/side effects for all the Medicines you take and that have been prescribed to you. Take any new Medicines after you have completely understood and accept all the possible adverse reactions/side effects.   Please note  You were cared for by a hospitalist during your hospital stay. If you have any questions about your discharge medications or the care you received while you were in the hospital after you are discharged, you can call the unit and asked to speak with the hospitalist on call if the hospitalist that took care of you is not available. Once you are discharged, your primary care physician will handle any further medical issues. Please note that NO REFILLS for any discharge medications will be authorized once you are discharged, as it is imperative that you return to your primary care physician (or establish a relationship with a primary care physician if you do not have one) for your aftercare needs so that they can reassess your need for medications and monitor your lab values.    Today   CHIEF COMPLAINT:   Chief Complaint  Patient presents with  . Cough  . Fever    HISTORY OF PRESENT ILLNESS:  Susan Jacobs  is a 65 y.o. female presented with fever cough and altered mental status and found to have pneumonia   VITAL SIGNS:  Blood pressure 135/66, pulse 71, temperature 98.1 F (36.7 C), temperature source Oral, resp. rate 18, height 5\' 4"  (1.626 m), weight 80.105 kg (176 lb 9.6 oz), SpO2 98 %.    PHYSICAL EXAMINATION:  GENERAL:  65  y.o.-year-old patient lying in the bed with no acute distress.  EYES: Pupils equal, round, reactive to light and accommodation. No scleral icterus. Extraocular muscles intact.  HEENT: Head atraumatic, normocephalic. Oropharynx and nasopharynx clear.  NECK:  Supple, no jugular venous distention. No thyroid enlargement, no tenderness.  LUNGS: Normal breath sounds bilaterally, no wheezing, rales,rhonchi or crepitation. No use of accessory muscles of respiration.  CARDIOVASCULAR: S1, S2 normal. No murmurs, rubs, or gallops.  ABDOMEN: Soft, non-tender, non-distended. Bowel sounds present. No organomegaly or mass.  EXTREMITIES: No pedal edema, cyanosis, or clubbing.  NEUROLOGIC: Cranial nerves II through XII are intact. Muscle strength 5/5 in all extremities. Sensation intact. Gait not checked.  PSYCHIATRIC: The patient is alert and oriented x 3.  SKIN: No obvious rash, lesion, or ulcer.   DATA REVIEW:   CBC  Recent Labs Lab 08/20/15 0130  WBC 26.9*  HGB 11.0*  HCT 33.0*  PLT 287    Chemistries   Recent Labs Lab 08/19/15 2039  08/21/15 0448  NA 137  < > 136  K 3.8  < > 3.7  CL 100*  < >  106  CO2 24  < > 23  GLUCOSE 108*  < > 129*  BUN 12  < > 21*  CREATININE 1.53*  < > 1.19*  CALCIUM 9.5  < > 8.8*  MG  --   --  2.2  AST 26  --   --   ALT 16  --   --   ALKPHOS 118  --   --   BILITOT 0.6  --   --   < > = values in this interval not displayed.  Cardiac Enzymes  Recent Labs Lab 08/19/15 2039  TROPONINI <0.03    Microbiology Results  Results for orders placed or performed during the hospital encounter of 08/19/15  Blood Culture (routine x 2)     Status: None (Preliminary result)   Collection Time: 08/19/15  8:39 PM  Result Value Ref Range Status   Specimen Description BLOOD RIGHT ASSIST CONTROL  Final   Special Requests   Final    BOTTLES DRAWN AEROBIC AND ANAEROBIC  AERO 2CC ANA 1CC   Culture NO GROWTH 3 DAYS  Final   Report Status PENDING  Incomplete  Blood  Culture (routine x 2)     Status: Abnormal   Collection Time: 08/19/15  8:44 PM  Result Value Ref Range Status   Specimen Description BLOOD LEFT ASSIST CONTROL  Final   Special Requests   Final    BOTTLES DRAWN AEROBIC AND ANAEROBIC  AERO 3CC ANA 1CC   Culture  Setup Time   Final    GRAM POSITIVE COCCI IN BOTH AEROBIC AND ANAEROBIC BOTTLES CRITICAL RESULT CALLED TO, READ BACK BY AND VERIFIED WITH: JASON ROBBINS 08/20/15 1432     Culture STREPTOCOCCUS PNEUMONIAE (A)  Final   Report Status 08/22/2015 FINAL  Final   Organism ID, Bacteria STREPTOCOCCUS PNEUMONIAE  Final      Susceptibility   Streptococcus pneumoniae - MIC*    ERYTHROMYCIN <=0.12 SENSITIVE Sensitive     LEVOFLOXACIN 0.5 SENSITIVE Sensitive     PENICILLIN Value in next row Sensitive      SENSITIVE<=0.06    CEFTRIAXONE Value in next row Sensitive      SENSITIVE<=0.12    * STREPTOCOCCUS PNEUMONIAE  Blood Culture ID Panel (Reflexed)     Status: Abnormal   Collection Time: 08/19/15  8:44 PM  Result Value Ref Range Status   Enterococcus species NOT DETECTED NOT DETECTED Final   Vancomycin resistance NOT DETECTED NOT DETECTED Final   Listeria monocytogenes NOT DETECTED NOT DETECTED Final   Staphylococcus species NOT DETECTED NOT DETECTED Final   Staphylococcus aureus NOT DETECTED NOT DETECTED Final   Methicillin resistance NOT DETECTED NOT DETECTED Final   Streptococcus species DETECTED (A) NOT DETECTED Final    Comment: CRITICAL RESULT CALLED TO, READ BACK BY AND VERIFIED WITH: JASON ROBBINS 08/20/15 1432 SGD    Streptococcus agalactiae NOT DETECTED NOT DETECTED Final   Streptococcus pneumoniae DETECTED (A) NOT DETECTED Final    Comment: CRITICAL RESULT CALLED TO, READ BACK BY AND VERIFIED WITH: JASON ROBBINS 08/20/15 1432 SGD    Streptococcus pyogenes NOT DETECTED NOT DETECTED Final   Acinetobacter baumannii NOT DETECTED NOT DETECTED Final   Enterobacteriaceae species NOT DETECTED NOT DETECTED Final   Enterobacter  cloacae complex NOT DETECTED NOT DETECTED Final   Escherichia coli NOT DETECTED NOT DETECTED Final   Klebsiella oxytoca NOT DETECTED NOT DETECTED Final   Klebsiella pneumoniae NOT DETECTED NOT DETECTED Final   Proteus species NOT DETECTED NOT DETECTED Final  Serratia marcescens NOT DETECTED NOT DETECTED Final   Carbapenem resistance NOT DETECTED NOT DETECTED Final   Haemophilus influenzae NOT DETECTED NOT DETECTED Final   Neisseria meningitidis NOT DETECTED NOT DETECTED Final   Pseudomonas aeruginosa NOT DETECTED NOT DETECTED Final   Candida albicans NOT DETECTED NOT DETECTED Final   Candida glabrata NOT DETECTED NOT DETECTED Final   Candida krusei NOT DETECTED NOT DETECTED Final   Candida parapsilosis NOT DETECTED NOT DETECTED Final   Candida tropicalis NOT DETECTED NOT DETECTED Final  Urine culture     Status: Abnormal   Collection Time: 08/19/15 10:02 PM  Result Value Ref Range Status   Specimen Description URINE, RANDOM  Final   Special Requests NONE  Final   Culture MULTIPLE SPECIES PRESENT, SUGGEST RECOLLECTION (A)  Final   Report Status 08/21/2015 FINAL  Final    Management plans discussed with the patient, family and they are in agreement.  CODE STATUS:  Code Status History    Date Active Date Inactive Code Status Order ID Comments User Context   08/20/2015 12:49 AM 08/22/2015  4:53 PM Full Code [id number]  Gery Prayebby Crosley, MD Inpatient      TOTAL TIME TAKING CARE OF THIS PATIENT: 35 minutes.    Alford HighlandWIETING, Dezeray Puccio M.D on 08/22/2015 at 5:04 PM  Between 7am to 6pm - Pager - [phone]  After 6pm go to [email or web address] - Social research officer, governmentpassword EPAS [email or web address]  [facility] Office  [phone]  CC: Primary care physician; [facility]

## 2015-08-24 LAB — CULTURE, BLOOD (ROUTINE X 2): CULTURE: NO GROWTH

## 2015-09-02 ENCOUNTER — Ambulatory Visit
Admission: RE | Admit: 2015-09-02 | Discharge: 2015-09-02 | Disposition: A | Discharge status: Home / Self Care | Payer: Medicaid Other | Source: Ambulatory Visit | Attending: Family Medicine | Admitting: Family Medicine

## 2015-09-02 ENCOUNTER — Other Ambulatory Visit: Payer: Self-pay | Admitting: Family Medicine

## 2015-09-02 DIAGNOSIS — J189 Pneumonia, unspecified organism: Secondary | ICD-10-CM

## 2015-09-02 DIAGNOSIS — K449 Diaphragmatic hernia without obstruction or gangrene: Secondary | ICD-10-CM | POA: Diagnosis not present

## 2015-09-02 DIAGNOSIS — J9811 Atelectasis: Secondary | ICD-10-CM | POA: Diagnosis not present

## 2015-09-02 DIAGNOSIS — Z8701 Personal history of pneumonia (recurrent): Secondary | ICD-10-CM | POA: Insufficient documentation

## 2015-09-02 DIAGNOSIS — R0781 Pleurodynia: Secondary | ICD-10-CM

## 2015-12-02 ENCOUNTER — Other Ambulatory Visit: Payer: Self-pay | Admitting: Physician Assistant

## 2015-12-02 DIAGNOSIS — Z1239 Encounter for other screening for malignant neoplasm of breast: Secondary | ICD-10-CM

## 2015-12-25 ENCOUNTER — Ambulatory Visit: Payer: Medicaid Other

## 2016-01-06 ENCOUNTER — Ambulatory Visit
Admission: RE | Admit: 2016-01-06 | Discharge: 2016-01-06 | Disposition: A | Discharge status: Home / Self Care | Payer: Medicaid Other | Source: Ambulatory Visit | Attending: Physician Assistant | Admitting: Physician Assistant

## 2016-01-06 DIAGNOSIS — Z1231 Encounter for screening mammogram for malignant neoplasm of breast: Secondary | ICD-10-CM | POA: Diagnosis present

## 2016-01-06 DIAGNOSIS — Z1239 Encounter for other screening for malignant neoplasm of breast: Secondary | ICD-10-CM

## 2016-01-11 ENCOUNTER — Other Ambulatory Visit: Payer: Self-pay | Admitting: Physician Assistant

## 2016-01-11 DIAGNOSIS — R928 Other abnormal and inconclusive findings on diagnostic imaging of breast: Secondary | ICD-10-CM

## 2016-01-28 ENCOUNTER — Other Ambulatory Visit: Payer: Medicaid Other

## 2016-01-28 ENCOUNTER — Ambulatory Visit: Payer: Medicaid Other

## 2016-02-02 ENCOUNTER — Ambulatory Visit
Admission: RE | Admit: 2016-02-02 | Discharge: 2016-02-02 | Disposition: A | Discharge status: Home / Self Care | Payer: Medicaid Other | Source: Ambulatory Visit | Attending: Physician Assistant | Admitting: Physician Assistant

## 2016-02-02 DIAGNOSIS — N6489 Other specified disorders of breast: Secondary | ICD-10-CM | POA: Insufficient documentation

## 2016-02-02 DIAGNOSIS — R928 Other abnormal and inconclusive findings on diagnostic imaging of breast: Secondary | ICD-10-CM

## 2017-02-15 ENCOUNTER — Other Ambulatory Visit: Payer: Self-pay

## 2017-02-15 ENCOUNTER — Other Ambulatory Visit: Payer: Self-pay | Admitting: Physician Assistant

## 2017-02-15 ENCOUNTER — Encounter: Payer: Self-pay | Admitting: *Deleted

## 2017-02-15 ENCOUNTER — Emergency Department
Admission: EM | Admit: 2017-02-15 | Discharge: 2017-02-15 | Disposition: A | Discharge status: Home / Self Care | Payer: Medicaid Other | Attending: Emergency Medicine | Admitting: Emergency Medicine

## 2017-02-15 ENCOUNTER — Emergency Department: Payer: Medicaid Other

## 2017-02-15 DIAGNOSIS — Z79899 Other long term (current) drug therapy: Secondary | ICD-10-CM | POA: Diagnosis not present

## 2017-02-15 DIAGNOSIS — I10 Essential (primary) hypertension: Secondary | ICD-10-CM | POA: Insufficient documentation

## 2017-02-15 DIAGNOSIS — J45909 Unspecified asthma, uncomplicated: Secondary | ICD-10-CM | POA: Diagnosis not present

## 2017-02-15 DIAGNOSIS — R05 Cough: Secondary | ICD-10-CM | POA: Diagnosis present

## 2017-02-15 DIAGNOSIS — Z7982 Long term (current) use of aspirin: Secondary | ICD-10-CM | POA: Diagnosis not present

## 2017-02-15 DIAGNOSIS — J441 Chronic obstructive pulmonary disease with (acute) exacerbation: Secondary | ICD-10-CM | POA: Insufficient documentation

## 2017-02-15 DIAGNOSIS — F17200 Nicotine dependence, unspecified, uncomplicated: Secondary | ICD-10-CM | POA: Insufficient documentation

## 2017-02-15 DIAGNOSIS — Z1231 Encounter for screening mammogram for malignant neoplasm of breast: Secondary | ICD-10-CM

## 2017-02-15 MED ORDER — ALBUTEROL SULFATE (2.5 MG/3ML) 0.083% IN NEBU: 5.0000 mg | INHALATION_SOLUTION | Freq: Once | RESPIRATORY_TRACT | Status: DC

## 2017-02-15 MED ORDER — DOXYCYCLINE HYCLATE 100 MG PO CAPS: 100.0000 mg | ORAL_CAPSULE | Freq: Two times a day (BID) | ORAL | 0 refills | Status: DC

## 2017-02-15 MED ORDER — ALBUTEROL SULFATE HFA 108 (90 BASE) MCG/ACT IN AERS: 2.0000 | INHALATION_SPRAY | RESPIRATORY_TRACT | 0 refills | Status: AC | PRN

## 2017-02-15 MED ORDER — PREDNISONE 20 MG PO TABS
60.0000 mg | ORAL_TABLET | Freq: Once | ORAL | Status: AC
  Administered 2017-02-15: 60 mg via ORAL
  Filled 2017-02-15: qty 3

## 2017-02-15 MED ORDER — IPRATROPIUM-ALBUTEROL 0.5-2.5 (3) MG/3ML IN SOLN
3.0000 mL | Freq: Once | RESPIRATORY_TRACT | Status: AC
  Administered 2017-02-15: 3 mL via RESPIRATORY_TRACT
  Filled 2017-02-15: qty 3

## 2017-02-15 MED ORDER — PREDNISONE 20 MG PO TABS: 40.0000 mg | ORAL_TABLET | Freq: Every day | ORAL | 0 refills | Status: DC

## 2017-02-15 NOTE — ED Triage Notes (Signed)
Pt to ED reporting she has had a cough for over two weeks. Pt has hx of asthma and reports she had been using her inhalers without relief. Pt reports she has been feeling SOB recently. Pt denies having a productive cough but reports she feels dizzy after coughing. Pt unsure if she has had fevers at home.   Pt has become SOB after talking to RN and had to take a break from talking.

## 2017-02-15 NOTE — ED Provider Notes (Signed)
[facility] Emergency Department Provider Note  ____________________________________________  Time seen: Approximately 4:03 PM  I have reviewed the triage vital signs and the nursing notes.   HISTORY  Chief Complaint Cough    HPI Susan Jacobs is a 66 y.o. female who complains of nonproductive cough shortness of breath and wheezing for the past month, slightly worsened with now a somewhat productive cough over the past week. Feels like her COPD. No recent travel, hospitalizations or surgeries. No history of DVT or PE. She is a smoker. Has some chest wall pain anteriorly at the lower ribs only with coughing. Not exertional. Not pleuritic. Sharp in mild to moderate in intensity. Nonradiating.     Past Medical History:  Diagnosis Date  . Asthma   . COPD (chronic obstructive pulmonary disease) (HCC)   . Hypercholesteremia   . Hypertension      Patient Active Problem List   Diagnosis Date Noted  . Community acquired pneumonia 08/19/2015  . HTN (hypertension) 08/19/2015  . COPD (chronic obstructive pulmonary disease) (HCC) 08/19/2015  . Dyslipidemia 08/19/2015  . Tobacco abuse 08/19/2015  . Pneumonia 08/19/2015     History reviewed. No pertinent surgical history.   Prior to Admission medications   Medication Sig Start Date End Date Taking? Authorizing Provider  acetaminophen (TYLENOL) 500 MG tablet Take 1,000 mg by mouth every 6 (six) hours as needed for mild pain, fever or headache.    [provider]  albuterol (PROVENTIL HFA) 108 (90 Base) MCG/ACT inhaler Inhale 2 puffs into the lungs every 4 (four) hours as needed for wheezing or shortness of breath. 02/15/17   Sharman Cheek, MD  albuterol (PROVENTIL HFA;VENTOLIN HFA) 108 (90 Base) MCG/ACT inhaler Inhale 2 puffs into the lungs every 6 (six) hours as needed for wheezing or shortness of breath.    [provider]  aspirin EC 81 MG tablet Take 81 mg by mouth daily.     [provider]  azithromycin (ZITHROMAX) 250 MG tablet One tablet daily for three more days 08/22/15   Alford Highland, MD  beclomethasone (QVAR) 40 MCG/ACT inhaler Inhale 2 puffs into the lungs 2 (two) times daily.    [provider]  cefUROXime (CEFTIN) 500 MG tablet Take 1 tablet (500 mg total) by mouth 2 (two) times daily with a meal. 08/22/15   Renae Gloss, Richard, MD  chlorpheniramine-HYDROcodone (TUSSIONEX PENNKINETIC ER) 10-8 MG/5ML SUER Take 5 mLs by mouth every 12 (twelve) hours as needed for cough.    [provider]  Coenzyme Q10 (COQ10) 100 MG CAPS Take 100 mg by mouth daily.    [provider]  doxycycline (VIBRAMYCIN) 100 MG capsule Take 1 capsule (100 mg total) by mouth 2 (two) times daily. 02/15/17   Sharman Cheek, MD  hydrochlorothiazide (MICROZIDE) 12.5 MG capsule Take 12.5 mg by mouth daily.    [provider]  losartan (COZAAR) 50 MG tablet Take 50 mg by mouth daily.    [provider]  nicotine (NICODERM CQ - DOSED IN MG/24 HOURS) 14 mg/24hr patch Place 1 patch (14 mg total) onto the skin daily. 08/22/15   Alford Highland, MD  omeprazole (PRILOSEC) 20 MG capsule Take 20 mg by mouth daily.    [provider]  predniSONE (DELTASONE) 20 MG tablet Take 2 tablets (40 mg total) by mouth daily. 02/15/17   Sharman Cheek, MD  simvastatin (ZOCOR) 20 MG tablet Take 20 mg by mouth at bedtime.    [provider]  Allergies Aspirin   Family History  Problem Relation Age of Onset  . Breast cancer Neg Hx     Social History Social History   Tobacco Use  . Smoking status: Heavy Tobacco Smoker  . Smokeless tobacco: Never Used  Substance Use Topics  . Alcohol use: Yes  . Drug use: No    Review of Systems  Constitutional:   No fever or chills.  ENT:   No sore throat. No rhinorrhea. Cardiovascular:   No chest pain or syncope. Respiratory:  positive shortness of breath and productive  cough Gastrointestinal:   Negative for abdominal pain, vomiting and diarrhea.  Musculoskeletal:   Negative for focal pain or swelling All other systems reviewed and are negative except as documented above in ROS and HPI.  ____________________________________________   PHYSICAL EXAM:  VITAL SIGNS: ED Triage Vitals  Enc Vitals Group     BP 02/15/17 1410 (!) 150/85     Pulse Rate 02/15/17 1410 92     Resp 02/15/17 1410 18     Temp 02/15/17 1410 98.7 F (37.1 C)     Temp Source 02/15/17 1410 Oral     SpO2 02/15/17 1410 98 %     Weight 02/15/17 1407 169 lb (76.7 kg)     Height 02/15/17 1407 5\' 4"  (1.626 m)     Head Circumference --      Peak Flow --      Pain Score --      Pain Loc --      Pain Edu? --      Excl. in GC? --     Vital signs reviewed, nursing assessments reviewed.   Constitutional:   Alert and oriented. Well appearing and in no distress. Eyes:   No scleral icterus.  EOMI. No nystagmus. No conjunctival pallor. PERRL. ENT   Head:   Normocephalic and atraumatic.   Nose:   No congestion/rhinnorhea.    Mouth/Throat:   MMM, no pharyngeal erythema. No peritonsillar mass.    Neck:   No meningismus. Full ROM. Hematological/Lymphatic/Immunilogical:   No cervical lymphadenopathy. Cardiovascular:   RRR. Symmetric bilateral radial and DP pulses.  No murmurs.  Respiratory:   Normal respiratory effort without tachypnea/retractions. diffuse expiratory wheezing, slightly prolonged expiratory phase. Inducible coughing with FEV1 maneuver. Gastrointestinal:   Soft and nontender. Non distended. There is no CVA tenderness.  No rebound, rigidity, or guarding. Genitourinary:   deferred Musculoskeletal:   Normal range of motion in all extremities. No joint effusions.  No lower extremity tenderness.  No edema. Neurologic:   Normal speech and language.  Motor grossly intact. No gross focal neurologic deficits are appreciated.  Skin:    Skin is warm, dry and intact. No rash  noted.  No petechiae, purpura, or bullae.  ____________________________________________    LABS (pertinent positives/negatives) (all labs ordered are listed, but only abnormal results are displayed) Labs Reviewed - No data to display ____________________________________________   EKG  interpreted by me Normal sinus rhythm rate of 95, normal axis intervals QRS ST segments and T waves. No evidence of right heart strain.  ____________________________________________    RADIOLOGY  Dg Chest 2 View  Result Date: 02/15/2017 CLINICAL DATA:  Cough, shortness of Breath EXAM: CHEST  2 VIEW COMPARISON:  09/02/2015 FINDINGS: Moderate-sized hiatal hernia. There is hyperinflation of the lungs compatible with COPD. No confluent opacities or effusions. Heart is normal size. IMPRESSION: COPD.  Moderate-sized hiatal hernia.  No active disease. Electronically Signed   By: Charlett NoseKevin  Dover M.D.  On: 02/15/2017 14:44    ____________________________________________   PROCEDURES Procedures  ____________________________________________     CLINICAL IMPRESSION / ASSESSMENT AND PLAN / ED COURSE  Pertinent labs & imaging results that were available during my care of the patient were reviewed by me and considered in my medical decision making (see chart for details).   patient presents with subacute to chronic shortness of breath and cough with some worsening symptoms, consistent with COPD exacerbation.Considering the patient's symptoms, medical history, and physical examination today, I have low suspicion for ACS, PE, TAD, pneumothorax, carditis, mediastinitis, pneumonia, CHF, or sepsis.  Patient given prednisone and DuoNeb here in the ED, I'll discharge her with prescriptions for prednisone burst and doxycycline as well as a refill of her albuterol if needed. Suitable for outpatient follow-up with her PCP at [facility].      ____________________________________________   FINAL CLINICAL  IMPRESSION(S) / ED DIAGNOSES    Final diagnoses:  COPD with acute exacerbation (HCC)      This SmartLink is deprecated. Use AVSMEDLIST instead to display the medication list for a patient.   Portions of this note were generated with dragon dictation software. Dictation errors may occur despite best attempts at proofreading.    Sharman CheekStafford, Shonia Skilling, MD 02/15/17 [phone]

## 2017-04-05 ENCOUNTER — Ambulatory Visit
Admission: RE | Admit: 2017-04-05 | Discharge: 2017-04-05 | Disposition: A | Discharge status: Home / Self Care | Payer: Medicaid Other | Source: Ambulatory Visit | Attending: Physician Assistant | Admitting: Physician Assistant

## 2017-04-05 DIAGNOSIS — Z1231 Encounter for screening mammogram for malignant neoplasm of breast: Secondary | ICD-10-CM | POA: Insufficient documentation

## 2017-12-06 ENCOUNTER — Encounter: Payer: Self-pay | Admitting: Physician Assistant

## 2017-12-21 ENCOUNTER — Other Ambulatory Visit: Payer: Self-pay | Admitting: Physician Assistant

## 2017-12-21 DIAGNOSIS — Z13820 Encounter for screening for osteoporosis: Secondary | ICD-10-CM

## 2017-12-26 ENCOUNTER — Other Ambulatory Visit: Payer: Self-pay

## 2017-12-26 DIAGNOSIS — Z1211 Encounter for screening for malignant neoplasm of colon: Secondary | ICD-10-CM

## 2018-01-09 ENCOUNTER — Ambulatory Visit
Admission: RE | Admit: 2018-01-09 | Discharge: 2018-01-09 | Disposition: A | Discharge status: Home / Self Care | Payer: Medicare Other | Source: Ambulatory Visit | Attending: Physician Assistant | Admitting: Physician Assistant

## 2018-01-09 DIAGNOSIS — Z13820 Encounter for screening for osteoporosis: Secondary | ICD-10-CM | POA: Diagnosis present

## 2018-01-09 DIAGNOSIS — J449 Chronic obstructive pulmonary disease, unspecified: Secondary | ICD-10-CM | POA: Diagnosis not present

## 2018-01-09 DIAGNOSIS — M8588 Other specified disorders of bone density and structure, other site: Secondary | ICD-10-CM | POA: Insufficient documentation

## 2018-01-09 DIAGNOSIS — F17200 Nicotine dependence, unspecified, uncomplicated: Secondary | ICD-10-CM | POA: Diagnosis not present

## 2018-01-09 DIAGNOSIS — Z780 Asymptomatic menopausal state: Secondary | ICD-10-CM | POA: Insufficient documentation

## 2018-01-12 ENCOUNTER — Telehealth: Payer: Self-pay

## 2018-01-12 NOTE — Telephone Encounter (Signed)
Patient contacted office to cancel her colonoscopy scheduled for Monday 01/15/18 with Dr. Allegra LaiVanga.  Patient informed front office there was a problem at pharmacy with the bowel prep prescription.  We did not receive a call regarding a problem.  She informed me that she had a sick family member.  I explained to her that there is a cancellation/reschedule fee that applies. Patient received verbal notification prior to scheduling colonoscopy and it states it on her instructions.  She stated that she is disabled would not pay.  I advised her that we would be more than happy to reschedule her for her colonoscopy when she is able to pay the cancellation fee.  Thanks [employer]le

## 2018-01-15 ENCOUNTER — Ambulatory Visit: Admission: RE | Admit: 2018-01-15 | Payer: Medicare Other | Source: Ambulatory Visit | Admitting: Gastroenterology

## 2018-01-15 ENCOUNTER — Encounter: Admission: RE | Payer: Self-pay | Source: Ambulatory Visit

## 2018-01-15 DIAGNOSIS — Z1211 Encounter for screening for malignant neoplasm of colon: Secondary | ICD-10-CM

## 2018-01-15 SURGERY — COLONOSCOPY WITH PROPOFOL
Anesthesia: General

## 2018-03-06 ENCOUNTER — Emergency Department
Admission: EM | Admit: 2018-03-06 | Discharge: 2018-03-06 | Disposition: A | Discharge status: Home / Self Care | Payer: Medicare Other | Attending: Emergency Medicine | Admitting: Emergency Medicine

## 2018-03-06 ENCOUNTER — Encounter: Payer: Self-pay | Admitting: Emergency Medicine

## 2018-03-06 ENCOUNTER — Other Ambulatory Visit: Payer: Self-pay

## 2018-03-06 ENCOUNTER — Emergency Department: Payer: Medicare Other

## 2018-03-06 DIAGNOSIS — J441 Chronic obstructive pulmonary disease with (acute) exacerbation: Secondary | ICD-10-CM | POA: Insufficient documentation

## 2018-03-06 DIAGNOSIS — J45909 Unspecified asthma, uncomplicated: Secondary | ICD-10-CM | POA: Insufficient documentation

## 2018-03-06 DIAGNOSIS — Z79899 Other long term (current) drug therapy: Secondary | ICD-10-CM | POA: Insufficient documentation

## 2018-03-06 DIAGNOSIS — Z7982 Long term (current) use of aspirin: Secondary | ICD-10-CM | POA: Diagnosis not present

## 2018-03-06 DIAGNOSIS — F17200 Nicotine dependence, unspecified, uncomplicated: Secondary | ICD-10-CM | POA: Insufficient documentation

## 2018-03-06 DIAGNOSIS — R0602 Shortness of breath: Secondary | ICD-10-CM | POA: Diagnosis present

## 2018-03-06 DIAGNOSIS — I10 Essential (primary) hypertension: Secondary | ICD-10-CM | POA: Diagnosis not present

## 2018-03-06 LAB — CBC WITH DIFFERENTIAL/PLATELET
Abs Immature Granulocytes: 0.05 10*3/uL (ref 0.00–0.07)
BASOS PCT: 0 %
Basophils Absolute: 0 10*3/uL (ref 0.0–0.1)
EOS ABS: 0.7 10*3/uL — AB (ref 0.0–0.5)
Eosinophils Relative: 6 %
HCT: 39.9 % (ref 36.0–46.0)
Hemoglobin: 12.4 g/dL (ref 12.0–15.0)
Immature Granulocytes: 1 %
Lymphocytes Relative: 24 %
Lymphs Abs: 2.6 10*3/uL (ref 0.7–4.0)
MCH: 25.1 pg — AB (ref 26.0–34.0)
MCHC: 31.1 g/dL (ref 30.0–36.0)
MCV: 80.6 fL (ref 80.0–100.0)
MONO ABS: 0.9 10*3/uL (ref 0.1–1.0)
MONOS PCT: 8 %
NEUTROS PCT: 61 %
Neutro Abs: 6.7 10*3/uL (ref 1.7–7.7)
PLATELETS: 399 10*3/uL (ref 150–400)
RBC: 4.95 MIL/uL (ref 3.87–5.11)
RDW: 15.7 % — AB (ref 11.5–15.5)
WBC: 10.9 10*3/uL — AB (ref 4.0–10.5)
nRBC: 0 % (ref 0.0–0.2)

## 2018-03-06 LAB — COMPREHENSIVE METABOLIC PANEL
ALT: 14 U/L (ref 0–44)
AST: 20 U/L (ref 15–41)
Albumin: 3.6 g/dL (ref 3.5–5.0)
Alkaline Phosphatase: 125 U/L (ref 38–126)
Anion gap: 9 (ref 5–15)
BILIRUBIN TOTAL: 0.5 mg/dL (ref 0.3–1.2)
BUN: 12 mg/dL (ref 8–23)
CO2: 25 mmol/L (ref 22–32)
Calcium: 9 mg/dL (ref 8.9–10.3)
Chloride: 105 mmol/L (ref 98–111)
Creatinine, Ser: 1.27 mg/dL — ABNORMAL HIGH (ref 0.44–1.00)
GFR calc Af Amer: 51 mL/min — ABNORMAL LOW (ref >60)
GFR calc non Af Amer: 44 mL/min — ABNORMAL LOW (ref >60)
Glucose, Bld: 114 mg/dL — ABNORMAL HIGH (ref 70–99)
Potassium: 3.6 mmol/L (ref 3.5–5.1)
Sodium: 139 mmol/L (ref 135–145)
TOTAL PROTEIN: 7.9 g/dL (ref 6.5–8.1)

## 2018-03-06 LAB — BRAIN NATRIURETIC PEPTIDE: B NATRIURETIC PEPTIDE 5: 15 pg/mL (ref 0.0–100.0)

## 2018-03-06 LAB — TROPONIN I: Troponin I: <0.03 ng/mL (ref <0.03)

## 2018-03-06 MED ORDER — PREDNISONE 20 MG PO TABS: ORAL_TABLET | ORAL | 0 refills | Status: DC

## 2018-03-06 MED ORDER — COMPRESSOR/NEBULIZER MISC: 1.0000 [IU] | 0 refills | Status: AC | PRN

## 2018-03-06 MED ORDER — ALBUTEROL SULFATE (2.5 MG/3ML) 0.083% IN NEBU: 2.5000 mg | INHALATION_SOLUTION | RESPIRATORY_TRACT | 0 refills | Status: AC | PRN

## 2018-03-06 MED ORDER — METHYLPREDNISOLONE SODIUM SUCC 125 MG IJ SOLR
125.0000 mg | Freq: Once | INTRAMUSCULAR | Status: AC
  Administered 2018-03-06: 125 mg via INTRAVENOUS
  Filled 2018-03-06: qty 2

## 2018-03-06 MED ORDER — IPRATROPIUM-ALBUTEROL 0.5-2.5 (3) MG/3ML IN SOLN
3.0000 mL | Freq: Once | RESPIRATORY_TRACT | Status: AC
  Administered 2018-03-06: 3 mL via RESPIRATORY_TRACT
  Filled 2018-03-06: qty 3

## 2018-03-06 NOTE — ED Provider Notes (Signed)
[facility] Emergency Department Provider Note   ____________________________________________   First MD Initiated Contact with Patient 03/06/18 [phone]     (approximate)  I have reviewed the triage vital signs and the nursing notes.   HISTORY  Chief Complaint Shortness of Breath    HPI Susan Jacobs is a 68 y.o. female who presents to the ED from home with a chief complaint of shortness of breath.  Patient has a history of COPD and has been treated intermittently for bronchitis over the past 4 months.  She has been given an albuterol inhaler but states she feels worse and the inhaler is not helping.  Over the past several days patient has had cough productive of yellow to green sputum and increased shortness of breath with wheezing.  Denies associated fever, chills, chest pain, abdominal pain, nausea, vomiting, diarrhea.  Denies recent travel or trauma.   Past Medical History:  Diagnosis Date  . Asthma   . COPD (chronic obstructive pulmonary disease) (HCC)   . Hypercholesteremia   . Hypertension     Patient Active Problem List   Diagnosis Date Noted  . Community acquired pneumonia 08/19/2015  . HTN (hypertension) 08/19/2015  . COPD (chronic obstructive pulmonary disease) (HCC) 08/19/2015  . Dyslipidemia 08/19/2015  . Tobacco abuse 08/19/2015  . Pneumonia 08/19/2015    History reviewed. No pertinent surgical history.  Prior to Admission medications   Medication Sig Start Date End Date Taking? Authorizing Provider  acetaminophen (TYLENOL) 500 MG tablet Take 1,000 mg by mouth every 6 (six) hours as needed for mild pain, fever or headache.    [provider]  albuterol (PROVENTIL HFA) 108 (90 Base) MCG/ACT inhaler Inhale 2 puffs into the lungs every 4 (four) hours as needed for wheezing or shortness of breath. 02/15/17   Sharman CheekStafford, Phillip, MD  albuterol (PROVENTIL HFA;VENTOLIN HFA) 108 (90 Base) MCG/ACT inhaler Inhale 2 puffs into the lungs  every 6 (six) hours as needed for wheezing or shortness of breath.    [provider]  albuterol (PROVENTIL) (2.5 MG/3ML) 0.083% nebulizer solution Take 3 mLs (2.5 mg total) by nebulization every 4 (four) hours as needed for wheezing or shortness of breath. 03/06/18   Irean HongSung,  J, MD  aspirin EC 81 MG tablet Take 81 mg by mouth daily.    [provider]  azithromycin (ZITHROMAX) 250 MG tablet One tablet daily for three more days 08/22/15   Alford HighlandWieting, Richard, MD  beclomethasone (QVAR) 40 MCG/ACT inhaler Inhale 2 puffs into the lungs 2 (two) times daily.    [provider]  cefUROXime (CEFTIN) 500 MG tablet Take 1 tablet (500 mg total) by mouth 2 (two) times daily with a meal. 08/22/15   Renae GlossWieting, Richard, MD  chlorpheniramine-HYDROcodone (TUSSIONEX PENNKINETIC ER) 10-8 MG/5ML SUER Take 5 mLs by mouth every 12 (twelve) hours as needed for cough.    [provider]  Coenzyme Q10 (COQ10) 100 MG CAPS Take 100 mg by mouth daily.    [provider]  doxycycline (VIBRAMYCIN) 100 MG capsule Take 1 capsule (100 mg total) by mouth 2 (two) times daily. 02/15/17   Sharman CheekStafford, Phillip, MD  hydrochlorothiazide (MICROZIDE) 12.5 MG capsule Take 12.5 mg by mouth daily.    [provider]  losartan (COZAAR) 50 MG tablet Take 50 mg by mouth daily.    [provider]  Nebulizers (COMPRESSOR/NEBULIZER) MISC 1 Units by Does not apply route every 4 (four) hours as needed. 03/06/18   Irean HongSung,  J,  MD  nicotine (NICODERM CQ - DOSED IN MG/24 HOURS) 14 mg/24hr patch Place 1 patch (14 mg total) onto the skin daily. 08/22/15   Alford Highland, MD  omeprazole (PRILOSEC) 20 MG capsule Take 20 mg by mouth daily.    [provider]  predniSONE (DELTASONE) 20 MG tablet 3 tablets PO qd x 4 days 03/06/18   Irean Hong, MD  simvastatin (ZOCOR) 20 MG tablet Take 20 mg by mouth at bedtime.    [provider]    Allergies Aspirin  Family History  Problem Relation  Age of Onset  . Breast cancer Neg Hx     Social History Social History   Tobacco Use  . Smoking status: Heavy Tobacco Smoker  . Smokeless tobacco: Never Used  Substance Use Topics  . Alcohol use: Yes  . Drug use: No    Review of Systems  Constitutional: No fever/chills Eyes: No visual changes. ENT: No sore throat. Cardiovascular: Denies chest pain. Respiratory: Positive for cough, wheezing and shortness of breath. Gastrointestinal: No abdominal pain.  No nausea, no vomiting.  No diarrhea.  No constipation. Genitourinary: Negative for dysuria. Musculoskeletal: Negative for back pain. Skin: Negative for rash. Neurological: Negative for headaches, focal weakness or numbness.   ____________________________________________   PHYSICAL EXAM:  VITAL SIGNS: ED Triage Vitals  Enc Vitals Group     BP 03/06/18 0300 (!) 168/134     Pulse Rate 03/06/18 0300 (!) 101     Resp 03/06/18 0300 (!) 28     Temp 03/06/18 0300 98.1 F (36.7 C)     Temp Source 03/06/18 0300 Oral     SpO2 03/06/18 0300 93 %     Weight 03/06/18 0251 162 lb (73.5 kg)     Height 03/06/18 0251 5\' 4"  (1.626 m)     Head Circumference --      Peak Flow --      Pain Score 03/06/18 0251 7     Pain Loc --      Pain Edu? --      Excl. in GC? --    Examined after DuoNeb: Constitutional: Alert and oriented. Well appearing and in no acute distress. Eyes: Conjunctivae are normal. PERRL. EOMI. Head: Atraumatic. Nose: No congestion/rhinnorhea. Mouth/Throat: Mucous membranes are moist.  Oropharynx non-erythematous. Neck: No stridor.   Cardiovascular: Normal rate, regular rhythm. Grossly normal heart sounds.  Good peripheral circulation. Respiratory: Normal respiratory effort.  No retractions. Lungs CTAB.  No wheezing. Gastrointestinal: Soft and nontender. No distention. No abdominal bruits. No CVA tenderness. Musculoskeletal: No lower extremity tenderness nor edema.  No joint effusions. Neurologic:  Normal speech  and language. No gross focal neurologic deficits are appreciated. No gait instability. Skin:  Skin is warm, dry and intact. No rash noted. Psychiatric: Mood and affect are normal. Speech and behavior are normal.  ____________________________________________   LABS (all labs ordered are listed, but only abnormal results are displayed)  Labs Reviewed  CBC WITH DIFFERENTIAL/PLATELET - Abnormal; Notable for the following components:      Result Value   WBC 10.9 (*)    MCH 25.1 (*)    RDW 15.7 (*)    Eosinophils Absolute 0.7 (*)    All other components within normal limits  COMPREHENSIVE METABOLIC PANEL - Abnormal; Notable for the following components:   Glucose, Bld 114 (*)    Creatinine, Ser 1.27 (*)    GFR calc non Af Amer 44 (*)    GFR calc Af Amer 51 (*)  All other components within normal limits  BRAIN NATRIURETIC PEPTIDE  TROPONIN I   ____________________________________________  EKG  ED ECG REPORT I, , J, the attending physician, personally viewed and interpreted this ECG.   Date: 03/06/2018  EKG Time: 0303  Rate: 98  Rhythm: normal EKG, normal sinus rhythm  Axis: Normal normal  Intervals:none  ST&T Change: Nonspecific  ____________________________________________  RADIOLOGY  ED MD interpretation: No acute cardiopulmonary process  Official radiology report(s): Dg Chest Portable 1 View  Result Date: 03/06/2018 CLINICAL DATA:  Initial evaluation for acute shortness of breath, cough. EXAM: PORTABLE CHEST 1 VIEW COMPARISON:  Prior radiograph from 02/15/2017. FINDINGS: Mild cardiomegaly. Mediastinal silhouette normal. Hiatal hernia noted. Lungs mildly hyperinflated with changes suggestive of emphysema. Superimposed streaky left basilar opacity most consistent with atelectasis. No other focal infiltrates. No pulmonary edema or pleural effusion. No pneumothorax. Osseous structures within normal limits. IMPRESSION: 1. Mild left basilar atelectasis. No other  active cardiopulmonary disease. 2. Probable underlying COPD. 3. Hiatal hernia. Electronically Signed   By: Rise MuBenjamin  McClintock M.D.   On: 03/06/2018 03:30    ____________________________________________   PROCEDURES  Procedure(s) performed: None  Procedures  Critical Care performed: No  ____________________________________________   INITIAL IMPRESSION / ASSESSMENT AND PLAN / ED COURSE  As part of my medical decision making, I reviewed the following data within the electronic MEDICAL RECORD NUMBER History obtained from family, Nursing notes reviewed and incorporated, Labs reviewed, EKG interpreted, Old chart reviewed, Radiograph reviewed and Notes from prior ED visits   68 year old female with COPD who presents with exacerbation. Differential includes, but is not limited to, viral syndrome, bronchitis including COPD exacerbation, pneumonia, reactive airway disease including asthma, CHF including exacerbation with or without pulmonary/interstitial edema, pneumothorax, ACS, thoracic trauma, and pulmonary embolism.  Patient feels significantly better after a DuoNeb.  125mg  IV Solu-Medrol administered.  Awaiting results of lab work.  Chest x-ray negative for pneumonia.  Will continue to monitor.  Clinical Course as of Mar 06 537  Tue Mar 06, 2018  [id number] Patient asleep.  Reexamined lungs which are now clear without wheezing.  Room saturation 96%.  Will discharge home on Prednisone burst, albuterol nebulizer machine with solution.  She will follow-up closely with her PCP.  Strict return precautions given.  Patient and spouse verbalize understanding and agree with plan of care.   [JS]    Clinical Course User Index [JS] Irean HongSung,  J, MD     ____________________________________________   FINAL CLINICAL IMPRESSION(S) / ED DIAGNOSES  Final diagnoses:  COPD exacerbation [facility](HCC)     ED Discharge Orders         Ordered    albuterol (PROVENTIL) (2.5 MG/3ML) 0.083% nebulizer solution  Every  4 hours PRN     03/06/18 0538    Nebulizers (COMPRESSOR/NEBULIZER) MISC  Every 4 hours PRN     03/06/18 0538    predniSONE (DELTASONE) 20 MG tablet     03/06/18 [id number]           Note:  This document was prepared using Dragon voice recognition software and may include unintentional dictation errors.    Irean HongSung,  J, MD 03/06/18 [phone]

## 2018-03-06 NOTE — Discharge Instructions (Addendum)
1.  Take steroid as prescribed (Prednisone 60 mg daily x4 days).  Start your next dose Wednesday morning. 2.  Use Albuterol nebulizer every 4 hours as needed for wheezing/difficulty breathing. 3.  Return to the ER for worsening symptoms, persistent vomiting, difficulty breathing or other concerns.

## 2018-03-06 NOTE — ED Triage Notes (Signed)
Pt presents to ED with sob that worsened tonight. Has been seen over the past 4 months at her pcp for the same and was told it was COPD and given an inhaler but pt report she feels worse and the inhaler is not helping. Productive frequent cough with increased work of breathing noted during triage.

## 2018-03-11 ENCOUNTER — Other Ambulatory Visit: Payer: Self-pay

## 2018-03-11 ENCOUNTER — Observation Stay
Admission: EM | Admit: 2018-03-11 | Discharge: 2018-03-14 | Disposition: A | Discharge status: Home / Self Care | Payer: Medicare Other | Attending: Internal Medicine | Admitting: Internal Medicine

## 2018-03-11 ENCOUNTER — Emergency Department: Payer: Medicare Other

## 2018-03-11 DIAGNOSIS — E7800 Pure hypercholesterolemia, unspecified: Secondary | ICD-10-CM | POA: Insufficient documentation

## 2018-03-11 DIAGNOSIS — Z7982 Long term (current) use of aspirin: Secondary | ICD-10-CM | POA: Diagnosis not present

## 2018-03-11 DIAGNOSIS — Z79899 Other long term (current) drug therapy: Secondary | ICD-10-CM | POA: Insufficient documentation

## 2018-03-11 DIAGNOSIS — Z886 Allergy status to analgesic agent status: Secondary | ICD-10-CM | POA: Insufficient documentation

## 2018-03-11 DIAGNOSIS — F17210 Nicotine dependence, cigarettes, uncomplicated: Secondary | ICD-10-CM | POA: Insufficient documentation

## 2018-03-11 DIAGNOSIS — E876 Hypokalemia: Secondary | ICD-10-CM | POA: Diagnosis not present

## 2018-03-11 DIAGNOSIS — I10 Essential (primary) hypertension: Secondary | ICD-10-CM | POA: Diagnosis not present

## 2018-03-11 DIAGNOSIS — K449 Diaphragmatic hernia without obstruction or gangrene: Secondary | ICD-10-CM | POA: Diagnosis not present

## 2018-03-11 DIAGNOSIS — J441 Chronic obstructive pulmonary disease with (acute) exacerbation: Principal | ICD-10-CM | POA: Diagnosis present

## 2018-03-11 LAB — CBC WITH DIFFERENTIAL/PLATELET
Abs Immature Granulocytes: 0.06 10*3/uL (ref 0.00–0.07)
Basophils Absolute: 0.1 10*3/uL (ref 0.0–0.1)
Basophils Relative: 1 %
Eosinophils Absolute: 1.8 10*3/uL — ABNORMAL HIGH (ref 0.0–0.5)
Eosinophils Relative: 15 %
HCT: 40 % (ref 36.0–46.0)
Hemoglobin: 12.3 g/dL (ref 12.0–15.0)
Immature Granulocytes: 1 %
Lymphocytes Relative: 14 %
Lymphs Abs: 1.7 10*3/uL (ref 0.7–4.0)
MCH: 25.4 pg — ABNORMAL LOW (ref 26.0–34.0)
MCHC: 30.8 g/dL (ref 30.0–36.0)
MCV: 82.5 fL (ref 80.0–100.0)
MONO ABS: 0.7 10*3/uL (ref 0.1–1.0)
Monocytes Relative: 5 %
Neutro Abs: 8 10*3/uL — ABNORMAL HIGH (ref 1.7–7.7)
Neutrophils Relative %: 64 %
Platelets: 404 10*3/uL — ABNORMAL HIGH (ref 150–400)
RBC: 4.85 MIL/uL (ref 3.87–5.11)
RDW: 15.9 % — ABNORMAL HIGH (ref 11.5–15.5)
WBC: 12.4 10*3/uL — ABNORMAL HIGH (ref 4.0–10.5)
nRBC: 0 % (ref 0.0–0.2)

## 2018-03-11 LAB — BASIC METABOLIC PANEL
Anion gap: 9 (ref 5–15)
BUN: 12 mg/dL (ref 8–23)
CO2: 23 mmol/L (ref 22–32)
Calcium: 8.9 mg/dL (ref 8.9–10.3)
Chloride: 106 mmol/L (ref 98–111)
Creatinine, Ser: 1.26 mg/dL — ABNORMAL HIGH (ref 0.44–1.00)
GFR calc Af Amer: 52 mL/min — ABNORMAL LOW (ref >60)
GFR calc non Af Amer: 45 mL/min — ABNORMAL LOW (ref >60)
GLUCOSE: 138 mg/dL — AB (ref 70–99)
Potassium: 3.2 mmol/L — ABNORMAL LOW (ref 3.5–5.1)
Sodium: 138 mmol/L (ref 135–145)

## 2018-03-11 LAB — TROPONIN I: Troponin I: <0.03 ng/mL (ref <0.03)

## 2018-03-11 LAB — INFLUENZA PANEL BY PCR (TYPE A & B)
Influenza A By PCR: NEGATIVE
Influenza B By PCR: NEGATIVE

## 2018-03-11 MED ORDER — HEPARIN SODIUM (PORCINE) 5000 UNIT/ML IJ SOLN
5000.0000 [IU] | Freq: Three times a day (TID) | INTRAMUSCULAR | Status: DC
  Filled 2018-03-11 (×5): qty 1

## 2018-03-11 MED ORDER — BUDESONIDE 0.5 MG/2ML IN SUSP
0.5000 mg | Freq: Two times a day (BID) | RESPIRATORY_TRACT | Status: DC
  Administered 2018-03-11 – 2018-03-14 (×6): 0.5 mg via RESPIRATORY_TRACT
  Filled 2018-03-11 (×6): qty 2

## 2018-03-11 MED ORDER — NICOTINE 14 MG/24HR TD PT24
14.0000 mg | MEDICATED_PATCH | Freq: Every day | TRANSDERMAL | Status: DC
  Filled 2018-03-11 (×3): qty 1

## 2018-03-11 MED ORDER — TIOTROPIUM BROMIDE MONOHYDRATE 18 MCG IN CAPS
18.0000 ug | ORAL_CAPSULE | Freq: Every day | RESPIRATORY_TRACT | Status: DC
  Administered 2018-03-11 – 2018-03-14 (×4): 18 ug via RESPIRATORY_TRACT
  Filled 2018-03-11: qty 5

## 2018-03-11 MED ORDER — SIMVASTATIN 20 MG PO TABS
20.0000 mg | ORAL_TABLET | Freq: Every day | ORAL | Status: DC
  Administered 2018-03-11 – 2018-03-13 (×3): 20 mg via ORAL
  Filled 2018-03-11 (×3): qty 1

## 2018-03-11 MED ORDER — SODIUM CHLORIDE 0.9 % IV SOLN
500.0000 mg | Freq: Once | INTRAVENOUS | Status: AC
  Administered 2018-03-11: 15:00:00 500 mg via INTRAVENOUS
  Filled 2018-03-11: qty 500

## 2018-03-11 MED ORDER — METHYLPREDNISOLONE SODIUM SUCC 125 MG IJ SOLR
125.0000 mg | Freq: Once | INTRAMUSCULAR | Status: AC
  Administered 2018-03-11: 125 mg via INTRAVENOUS
  Filled 2018-03-11: qty 2

## 2018-03-11 MED ORDER — ALBUTEROL SULFATE (2.5 MG/3ML) 0.083% IN NEBU
INHALATION_SOLUTION | RESPIRATORY_TRACT | Status: AC
  Administered 2018-03-11: 10 mg
  Filled 2018-03-11: qty 12

## 2018-03-11 MED ORDER — ALBUTEROL (5 MG/ML) CONTINUOUS INHALATION SOLN
10.0000 mg/h | INHALATION_SOLUTION | RESPIRATORY_TRACT | Status: DC
  Filled 2018-03-11: qty 20

## 2018-03-11 MED ORDER — ACETAMINOPHEN 500 MG PO TABS: 1000.0000 mg | ORAL_TABLET | Freq: Four times a day (QID) | ORAL | Status: DC | PRN

## 2018-03-11 MED ORDER — ASPIRIN EC 81 MG PO TBEC
81.0000 mg | DELAYED_RELEASE_TABLET | Freq: Every day | ORAL | Status: DC
  Administered 2018-03-12 – 2018-03-14 (×3): 81 mg via ORAL
  Filled 2018-03-11 (×3): qty 1

## 2018-03-11 MED ORDER — GUAIFENESIN 100 MG/5ML PO SOLN
5.0000 mL | ORAL | Status: DC | PRN
  Administered 2018-03-11 – 2018-03-13 (×7): 100 mg via ORAL
  Filled 2018-03-11 (×9): qty 5

## 2018-03-11 MED ORDER — IPRATROPIUM-ALBUTEROL 0.5-2.5 (3) MG/3ML IN SOLN
RESPIRATORY_TRACT | Status: AC
  Administered 2018-03-11: 6 mL
  Filled 2018-03-11: qty 6

## 2018-03-11 MED ORDER — PANTOPRAZOLE SODIUM 40 MG PO TBEC
40.0000 mg | DELAYED_RELEASE_TABLET | Freq: Every day | ORAL | Status: DC
  Administered 2018-03-11 – 2018-03-14 (×4): 40 mg via ORAL
  Filled 2018-03-11 (×4): qty 1

## 2018-03-11 MED ORDER — SODIUM CHLORIDE 0.9 % IV SOLN
1.0000 g | Freq: Once | INTRAVENOUS | Status: AC
  Administered 2018-03-11: 1 g via INTRAVENOUS
  Filled 2018-03-11: qty 10

## 2018-03-11 MED ORDER — DOCUSATE SODIUM 100 MG PO CAPS: 100.0000 mg | ORAL_CAPSULE | Freq: Two times a day (BID) | ORAL | Status: DC | PRN

## 2018-03-11 MED ORDER — LEVOFLOXACIN 500 MG PO TABS: 500.0000 mg | ORAL_TABLET | Freq: Every day | ORAL | Status: DC

## 2018-03-11 MED ORDER — BECLOMETHASONE DIPROPIONATE 40 MCG/ACT IN AERS: 2.0000 | INHALATION_SPRAY | Freq: Two times a day (BID) | RESPIRATORY_TRACT | Status: DC

## 2018-03-11 MED ORDER — METHYLPREDNISOLONE SODIUM SUCC 125 MG IJ SOLR
60.0000 mg | Freq: Four times a day (QID) | INTRAMUSCULAR | Status: DC
  Administered 2018-03-11 – 2018-03-12 (×5): 60 mg via INTRAVENOUS
  Filled 2018-03-11 (×5): qty 2

## 2018-03-11 MED ORDER — IPRATROPIUM-ALBUTEROL 0.5-2.5 (3) MG/3ML IN SOLN
3.0000 mL | RESPIRATORY_TRACT | Status: DC
  Administered 2018-03-11 – 2018-03-12 (×8): 3 mL via RESPIRATORY_TRACT
  Filled 2018-03-11 (×7): qty 3

## 2018-03-11 MED ORDER — HYDROCHLOROTHIAZIDE 12.5 MG PO CAPS
12.5000 mg | ORAL_CAPSULE | Freq: Every day | ORAL | Status: DC
  Administered 2018-03-11 – 2018-03-14 (×4): 12.5 mg via ORAL
  Filled 2018-03-11 (×4): qty 1

## 2018-03-11 MED ORDER — IPRATROPIUM-ALBUTEROL 0.5-2.5 (3) MG/3ML IN SOLN
RESPIRATORY_TRACT | Status: AC
  Filled 2018-03-11: qty 3

## 2018-03-11 MED ORDER — POTASSIUM CHLORIDE CRYS ER 20 MEQ PO TBCR
20.0000 meq | EXTENDED_RELEASE_TABLET | Freq: Two times a day (BID) | ORAL | Status: DC
  Administered 2018-03-11 – 2018-03-14 (×6): 20 meq via ORAL
  Filled 2018-03-11 (×7): qty 1

## 2018-03-11 MED ORDER — LOSARTAN POTASSIUM 50 MG PO TABS
50.0000 mg | ORAL_TABLET | Freq: Every day | ORAL | Status: DC
  Administered 2018-03-12: 09:00:00 50 mg via ORAL
  Filled 2018-03-11: qty 1

## 2018-03-11 MED ORDER — COQ10 100 MG PO CAPS: 100.0000 mg | ORAL_CAPSULE | Freq: Every day | ORAL | Status: DC

## 2018-03-11 MED ORDER — LEVOFLOXACIN 500 MG PO TABS
500.0000 mg | ORAL_TABLET | Freq: Every day | ORAL | Status: AC
  Administered 2018-03-12: 500 mg via ORAL
  Filled 2018-03-11: qty 1

## 2018-03-11 MED ORDER — LEVOFLOXACIN 250 MG PO TABS
250.0000 mg | ORAL_TABLET | Freq: Every day | ORAL | Status: DC
  Administered 2018-03-13 – 2018-03-14 (×2): 250 mg via ORAL
  Filled 2018-03-11 (×2): qty 1

## 2018-03-11 NOTE — H&P (Signed)
[employer] [facility] [place] [facility]   PATIENT NAME: Susan Jacobs    MR#:  [id number]  DATE OF BIRTH:  Apr 26, 1950  DATE OF ADMISSION:  03/11/2018  PRIMARY CARE PHYSICIAN: Clinic, General Medical   REQUESTING/REFERRING PHYSICIAN: McShane  CHIEF COMPLAINT:   Chief Complaint  Patient presents with  . Shortness of Breath  . Chest Pain    HISTORY OF PRESENT ILLNESS: Susan Jacobs  is a 68 y.o. female with a known history of COPD, hypercholesterolemia, hypertension-was in the emergency room twice in last few weeks for worsening of COPD.  She was given nebulizer treatment and IV steroids and discharged home.  She was not given any antibiotics.  She continued to feel worsening shortness of breath so came back to emergency room.  Noted to have significant wheezing.  ER gave steroid and nebulizer treatment and gave to hospitalist team for further management of her symptoms as her wheezing continued. She denies any cough fever or sputum production.  PAST MEDICAL HISTORY:   Past Medical History:  Diagnosis Date  . Asthma   . COPD (chronic obstructive pulmonary disease) (HCC)   . Hypercholesteremia   . Hypertension     PAST SURGICAL HISTORY: History reviewed. No pertinent surgical history.  SOCIAL HISTORY:  Social History   Tobacco Use  . Smoking status: Former Smoker    Last attempt to quit: 03/05/2018    Years since quitting: 0.0  . Smokeless tobacco: Never Used  Substance Use Topics  . Alcohol use: Yes    FAMILY HISTORY:  Family History  Problem Relation Age of Onset  . Breast cancer Neg Hx     DRUG ALLERGIES:  Allergies  Allergen Reactions  . Aspirin Palpitations and Other (See Comments)    Pt states that she is able to use the lower dose.      REVIEW OF SYSTEMS:   CONSTITUTIONAL: No fever, fatigue or weakness.  EYES: No blurred or double vision.  EARS, NOSE, AND THROAT: No tinnitus or ear pain.  RESPIRATORY: Have cough, shortness of breath, wheezing  or hemoptysis.  CARDIOVASCULAR: No chest pain, orthopnea, edema.  GASTROINTESTINAL: No nausea, vomiting, diarrhea or abdominal pain.  GENITOURINARY: No dysuria, hematuria.  ENDOCRINE: No polyuria, nocturia,  HEMATOLOGY: No anemia, easy bruising or bleeding SKIN: No rash or lesion. MUSCULOSKELETAL: No joint pain or arthritis.   NEUROLOGIC: No tingling, numbness, weakness.  PSYCHIATRY: No anxiety or depression.   MEDICATIONS AT HOME:  Prior to Admission medications   Medication Sig Start Date End Date Taking? Authorizing Provider  acetaminophen (TYLENOL) 500 MG tablet Take 1,000 mg by mouth every 6 (six) hours as needed for mild pain, fever or headache.   Yes [provider]  albuterol (PROVENTIL HFA) 108 (90 Base) MCG/ACT inhaler Inhale 2 puffs into the lungs every 4 (four) hours as needed for wheezing or shortness of breath. 02/15/17  Yes Sharman Cheek, MD  aspirin EC 81 MG tablet Take 81 mg by mouth daily.   Yes [provider]  beclomethasone (QVAR) 40 MCG/ACT inhaler Inhale 2 puffs into the lungs 2 (two) times daily.   Yes [provider]  Coenzyme Q10 (COQ10) 100 MG CAPS Take 100 mg by mouth daily.   Yes [provider]  hydrochlorothiazide (MICROZIDE) 12.5 MG capsule Take 12.5 mg by mouth daily.   Yes [provider]  losartan (COZAAR) 50 MG tablet Take 50 mg by mouth every evening.    Yes [provider]  omeprazole (PRILOSEC) 20  MG capsule Take 20 mg by mouth daily.   Yes [provider]  simvastatin (ZOCOR) 20 MG tablet Take 20 mg by mouth at bedtime.   Yes [provider]  albuterol (PROVENTIL) (2.5 MG/3ML) 0.083% nebulizer solution Take 3 mLs (2.5 mg total) by nebulization every 4 (four) hours as needed for wheezing or shortness of breath. 03/06/18   Irean Hong, MD  azithromycin (ZITHROMAX) 250 MG tablet One tablet daily for three more days Patient not taking: Reported on 03/11/2018 08/22/15   Alford Highland, MD  cefUROXime (CEFTIN) 500 MG tablet Take 1 tablet (500 mg total) by mouth 2 (two) times daily with a meal. 08/22/15   Alford Highland, MD  doxycycline (VIBRAMYCIN) 100 MG capsule Take 1 capsule (100 mg total) by mouth 2 (two) times daily. Patient not taking: Reported on 03/11/2018 02/15/17   Sharman Cheek, MD  Nebulizers (COMPRESSOR/NEBULIZER) MISC 1 Units by Does not apply route every 4 (four) hours as needed. 03/06/18   Irean Hong, MD  nicotine (NICODERM CQ - DOSED IN MG/24 HOURS) 14 mg/24hr patch Place 1 patch (14 mg total) onto the skin daily. Patient not taking: Reported on 03/11/2018 08/22/15   Alford Highland, MD  predniSONE (DELTASONE) 20 MG tablet 3 tablets PO qd x 4 days 03/06/18   Irean Hong, MD      PHYSICAL EXAMINATION:   VITAL SIGNS: Blood pressure (!) 155/80, pulse 96, temperature 98 F (36.7 C), temperature source Oral, resp. rate 18, height 5\' 6"  (1.676 m), weight 72.6 kg, SpO2 95 %.  GENERAL:  68 y.o.-year-old patient lying in the bed with no acute distress.  EYES: Pupils equal, round, reactive to light and accommodation. No scleral icterus. Extraocular muscles intact.  HEENT: Head atraumatic, normocephalic. Oropharynx and nasopharynx clear.  NECK:  Supple, no jugular venous distention. No thyroid enlargement, no tenderness.  LUNGS: Normal breath sounds bilaterally, bilateral wheezing, no crepitation. No use of accessory muscles of respiration.  CARDIOVASCULAR: S1, S2 normal. No murmurs, rubs, or gallops.  ABDOMEN: Soft, nontender, nondistended. Bowel sounds present. No organomegaly or mass.  EXTREMITIES: No pedal edema, cyanosis, or clubbing.  NEUROLOGIC: Cranial nerves II through XII are intact. Muscle strength 5/5 in all extremities. Sensation intact. Gait not checked.  PSYCHIATRIC: The patient is alert and oriented x 3.  SKIN: No obvious rash, lesion, or ulcer.   LABORATORY PANEL:   CBC Recent Labs  Lab 03/06/18 0322 03/11/18 0846  WBC 10.9* 12.4*   HGB 12.4 12.3  HCT 39.9 40.0  PLT 399 404*  MCV 80.6 82.5  MCH 25.1* 25.4*  MCHC 31.1 30.8  RDW 15.7* 15.9*  LYMPHSABS 2.6 1.7  MONOABS 0.9 0.7  EOSABS 0.7* 1.8*  BASOSABS 0.0 0.1   ------------------------------------------------------------------------------------------------------------------  Chemistries  Recent Labs  Lab 03/06/18 0322 03/11/18 0846  NA 139 138  K 3.6 3.2*  CL 105 106  CO2 25 23  GLUCOSE 114* 138*  BUN 12 12  CREATININE 1.27* 1.26*  CALCIUM 9.0 8.9  AST 20  --   ALT 14  --   ALKPHOS 125  --   BILITOT 0.5  --    ------------------------------------------------------------------------------------------------------------------ estimated creatinine clearance is 45.4 mL/min (A) (by C-G formula based on SCr of 1.26 mg/dL (H)). ------------------------------------------------------------------------------------------------------------------ No results for input(s): TSH, T4TOTAL, T3FREE, THYROIDAB in the last 72 hours.  Invalid input(s): FREET3   Coagulation profile No results for input(s): INR, PROTIME in the last 168 hours. ------------------------------------------------------------------------------------------------------------------- No results for input(s): DDIMER in the  last 72 hours. -------------------------------------------------------------------------------------------------------------------  Cardiac Enzymes Recent Labs  Lab 03/06/18 0322 03/11/18 0846  TROPONINI <0.03 <0.03   ------------------------------------------------------------------------------------------------------------------ Invalid input(s): POCBNP  ---------------------------------------------------------------------------------------------------------------  Urinalysis    Component Value Date/Time   COLORURINE YELLOW (A) 08/19/2015 2202   APPEARANCEUR CLEAR (A) 08/19/2015 2202   APPEARANCEUR Clear 09/06/2011 2024   LABSPEC 1.015 08/19/2015 2202   LABSPEC  1.023 09/06/2011 2024   PHURINE 5.0 08/19/2015 2202   GLUCOSEU NEGATIVE 08/19/2015 2202   GLUCOSEU Negative 09/06/2011 2024   HGBUR 1+ (A) 08/19/2015 2202   BILIRUBINUR NEGATIVE 08/19/2015 2202   BILIRUBINUR Negative 09/06/2011 2024   KETONESUR NEGATIVE 08/19/2015 2202   PROTEINUR NEGATIVE 08/19/2015 2202   NITRITE NEGATIVE 08/19/2015 2202   LEUKOCYTESUR NEGATIVE 08/19/2015 2202   LEUKOCYTESUR Negative 09/06/2011 2024     RADIOLOGY: Dg Chest 2 View  Result Date: 03/11/2018 CLINICAL DATA:  Patient with cough for 6 months. EXAM: CHEST - 2 VIEW COMPARISON:  Chest radiograph 03/06/2018 FINDINGS: Monitoring leads overlie the patient. Small hiatal hernia. Normal cardiac and mediastinal contours. No consolidative pulmonary opacities. No pleural effusion or pneumothorax. Thoracic spine degenerative changes. IMPRESSION: No acute cardiopulmonary process.  Small hiatal hernia. Electronically Signed   By: Annia Beltrew  Davis M.D.   On: 03/11/2018 09:13    EKG: Orders placed or performed during the hospital encounter of 03/11/18  . ED EKG  . ED EKG  . EKG 12-Lead  . EKG 12-Lead  . EKG 12-Lead  . EKG 12-Lead    IMPRESSION AND PLAN:  *Acute COPD exacerbation due to bronchitis. IV and inhaled steroid.  Nebulizer therapy. Start on Levaquin.  *Hypertension continue hydrochlorothiazide and losartan.  *Hyperlipidemia Continue simvastatin.  *Hypokalemia Replete replace oral and recheck.  *Active smoking Counseled to quit smoking for 4 minutes and offered nicotine patch.  All the records are reviewed and case discussed with ED provider. Management plans discussed with the patient, family and they are in agreement.  CODE STATUS:    Code Status Orders  (From admission, onward)         Start     Ordered   03/11/18 1055  Full code  Continuous     03/11/18 1055        Code Status History    Date Active Date Inactive Code Status Order ID Comments User Context   08/20/2015 0049 08/22/2015  1653 Full Code [id number]  Gery Prayrosley, Debby, MD Inpatient       TOTAL TIME TAKING CARE OF THIS PATIENT-45 minutes.    Altamese DillingVaibhavkumar Sage Hammill M.D on 03/11/2018   Between 7am to 6pm - Pager - [phone]  After 6pm go to [email or web address] - password [email or web address]  [facility]  Office  [phone]  CC: Primary care physician; Clinic, General Medical   Note: This dictation was prepared with Dragon dictation along with smaller phrase technology. Any transcriptional errors that result from this process are unintentional.

## 2018-03-11 NOTE — Progress Notes (Signed)
PHARMACIST - PHYSICIAN ORDER COMMUNICATION  CONCERNING: P&T Medication Policy on Herbal Medications  DESCRIPTION:  This patient's order for:  CoQ10  has been noted.  This product(s) is classified as an "herbal" or natural product. Due to a lack of definitive safety studies or FDA approval, nonstandard manufacturing practices, plus the potential risk of unknown drug-drug interactions while on inpatient medications, the Pharmacy and Therapeutics Committee does not permit the use of "herbal" or natural products of this type within [facility].   ACTION TAKEN: The pharmacy department is unable to verify this order at this time and your patient has been informed of this safety policy. Please reevaluate patient's clinical condition at discharge and address if the herbal or natural product(s) should be resumed at that time.  Pricilla Riffle, PharmD Pharmacy Resident  03/11/2018 11:08 AM

## 2018-03-11 NOTE — ED Notes (Signed)
ED TO INPATIENT HANDOFF REPORT  Name/Age/Gender Susan EdelsonLizzie B Jacobs 68 y.o. female  Code Status Code Status History    Date Active Date Inactive Code Status Order ID Comments User Context   08/20/2015 0049 08/22/2015 1653 Full Code [id number]  Gery Prayrosley, Debby, MD Inpatient      Home/SNF/Other Home  Chief Complaint chest pain SOB  Level of Care/Admitting Diagnosis ED Disposition    ED Disposition Condition Comment   Admit  Hospital Area: [facility] [[id number]]  Level of Care: Med-Surg [16]  Diagnosis: COPD exacerbation [facility](HCC) [[id number]][[id number]]  Admitting Physician: Altamese DillingVACHHANI, VAIBHAVKUMAR [phone][[id number]]  Attending Physician: Altamese DillingVACHHANI, VAIBHAVKUMAR [phone][[id number]]  Estimated length of stay: past midnight tomorrow  Certification:: I certify this patient will need inpatient services for at least 2 midnights  PT Class (Do Not Modify): Inpatient [101]  PT Acc Code (Do Not Modify): Private [1]       Medical History Past Medical History:  Diagnosis Date  . Asthma   . COPD (chronic obstructive pulmonary disease) (HCC)   . Hypercholesteremia   . Hypertension     Allergies Allergies  Allergen Reactions  . Aspirin Palpitations and Other (See Comments)    Pt states that she is able to use the lower dose.      IV Location/Drains/Wounds Patient Lines/Drains/Airways Status   Active Line/Drains/Airways    Name:   Placement date:   Placement time:   Site:   Days:   Peripheral IV 03/11/18 Right Forearm   03/11/18    0903    Forearm   less than 1          Labs/Imaging Results for orders placed or performed during the hospital encounter of 03/11/18 (from the past 48 hour(s))  CBC with Differential     Status: Abnormal   Collection Time: 03/11/18  8:46 AM  Result Value Ref Range   WBC 12.4 (H) 4.0 - 10.5 K/uL   RBC 4.85 3.87 - 5.11 MIL/uL   Hemoglobin 12.3 12.0 - 15.0 g/dL   HCT 65.740.0 84.636.0 - 96.246.0 %   MCV 82.5 80.0 - 100.0 fL   MCH 25.4 (L) 26.0 - 34.0 pg   MCHC 30.8 30.0 - 36.0  g/dL   RDW 95.215.9 (H) 84.111.5 - 32.415.5 %   Platelets 404 (H) 150 - 400 K/uL   nRBC 0.0 0.0 - 0.2 %   Neutrophils Relative % 64 %   Neutro Abs 8.0 (H) 1.7 - 7.7 K/uL   Lymphocytes Relative 14 %   Lymphs Abs 1.7 0.7 - 4.0 K/uL   Monocytes Relative 5 %   Monocytes Absolute 0.7 0.1 - 1.0 K/uL   Eosinophils Relative 15 %   Eosinophils Absolute 1.8 (H) 0.0 - 0.5 K/uL   Basophils Relative 1 %   Basophils Absolute 0.1 0.0 - 0.1 K/uL   Immature Granulocytes 1 %   Abs Immature Granulocytes 0.06 0.00 - 0.07 K/uL    Comment: Performed at [facility], [address] [place], [place] [address]  Troponin I - Once     Status: None   Collection Time: 03/11/18  8:46 AM  Result Value Ref Range   Troponin I <0.03 <0.03 ng/mL    Comment: Performed at [facility], [address] [place], [place] [address]  Basic metabolic panel     Status: Abnormal   Collection Time: 03/11/18  8:46 AM  Result Value Ref Range   Sodium 138 135 - 145 mmol/L   Potassium 3.2 (L) 3.5 - 5.1  mmol/L   Chloride 106 98 - 111 mmol/L   CO2 23 22 - 32 mmol/L   Glucose, Bld 138 (H) 70 - 99 mg/dL   BUN 12 8 - 23 mg/dL   Creatinine, Ser 5.18 (H) 0.44 - 1.00 mg/dL   Calcium 8.9 8.9 - 98.4 mg/dL   GFR calc non Af Amer 45 (L) >60 mL/min   GFR calc Af Amer 52 (L) >60 mL/min   Anion gap 9 5 - 15    Comment: Performed at [facility], [address] [place], [place] [address]  Influenza panel by PCR (type A & B)     Status: None   Collection Time: 03/11/18  8:55 AM  Result Value Ref Range   Influenza A By PCR NEGATIVE NEGATIVE   Influenza B By PCR NEGATIVE NEGATIVE    Comment: (NOTE) The Xpert Xpress Flu assay is intended as an aid in the diagnosis of  influenza and should not be used as a sole basis for treatment.  This  assay is FDA approved for nasopharyngeal swab specimens only. Nasal  washings and aspirates are unacceptable for Xpert Xpress Flu testing. Performed at [facility], [address], [place], [place] [address]    Dg Chest 2 View  Result Date: 03/11/2018 CLINICAL DATA:  Patient with cough for 6 months. EXAM: CHEST - 2 VIEW COMPARISON:  Chest radiograph 03/06/2018 FINDINGS: Monitoring leads overlie the patient. Small hiatal hernia. Normal cardiac and mediastinal contours. No consolidative pulmonary opacities. No pleural effusion or pneumothorax. Thoracic spine degenerative changes. IMPRESSION: No acute cardiopulmonary process.  Small hiatal hernia. Electronically Signed   By: Annia Belt M.D.   On: 03/11/2018 09:13    Pending Labs [employer] (From admission, onward)    Start     Ordered   Signed and Held  HIV antibody (Routine Testing)  Once,   R     Signed and Held   Signed and Armed forces training and education officer morning,   R     Signed and Held   Signed and Held  CBC  Tomorrow morning,   R     Signed and Held   Signed and Held  CBC  (heparin)  Once,   R    Comments:  Baseline for heparin therapy IF NOT ALREADY DRAWN.  Notify MD if PLT < 100 K.    Signed and Held   Signed and Held  Creatinine, serum  (heparin)  Once,   R    Comments:  Baseline for heparin therapy IF NOT ALREADY DRAWN.    Signed and Held          Vitals/Pain Today's Vitals   03/11/18 0936 03/11/18 0948 03/11/18 1000 03/11/18 1012  BP:      Pulse: 97 92 85 76  Resp: (!) 22 (!) 34 10 19  Temp:      TempSrc:      SpO2: 92% 97% 100% 100%  Weight:      Height:      PainSc:        Isolation Precautions No active isolations  Medications Medications  albuterol (PROVENTIL,VENTOLIN) solution continuous neb (has no administration in time range)  ipratropium-albuterol (DUONEB) 0.5-2.5 (3) MG/3ML nebulizer solution 3 mL (3 mLs Nebulization Given 03/11/18 0945)  budesonide (PULMICORT) nebulizer solution 0.5 mg (has no administration in time range)  levofloxacin (LEVAQUIN) tablet 500 mg (has no administration in time range)  methylPREDNISolone sodium succinate (SOLU-MEDROL)  125 mg/2 mL  injection 60 mg (has no administration in time range)  tiotropium (SPIRIVA) inhalation capsule ([facility] use ONLY) 18 mcg (has no administration in time range)  cefTRIAXone (ROCEPHIN) 1 g in sodium chloride 0.9 % 100 mL IVPB (has no administration in time range)  azithromycin (ZITHROMAX) 500 mg in sodium chloride 0.9 % 250 mL IVPB (has no administration in time range)  ipratropium-albuterol (DUONEB) 0.5-2.5 (3) MG/3ML nebulizer solution (6 mLs  Given 03/11/18 0840)  methylPREDNISolone sodium succinate (SOLU-MEDROL) 125 mg/2 mL injection 125 mg (125 mg Intravenous Given 03/11/18 0853)  albuterol (PROVENTIL) (2.5 MG/3ML) 0.083% nebulizer solution (10 mg  Given 03/11/18 0946)    Mobility walks

## 2018-03-11 NOTE — Progress Notes (Signed)
Levofloxacin dose adjusted from 500 mg po daily to 500 mg po daily x 1 day followed by 250 mg po daily due to CrCl 20 to 49 mL/min.  Lura Falor A. Dahlia Bailiff, PharmD, BCPS Clinical Pharmacist 03/11/2018 13:02

## 2018-03-11 NOTE — ED Provider Notes (Addendum)
[facility] Emergency Department Provider Note  ____________________________________________   I have reviewed the triage vital signs and the nursing notes. Where available I have reviewed prior notes and, if possible and indicated, outside hospital notes.    HISTORY  Chief Complaint Shortness of Breath and Chest Pain    HPI Susan Jacobs is a 68 y.o. female  Presents today complaining of cough and wheeze, patient's been doing this for 5 months she states.  She was seen here 5 days ago.  She was given a prescription for a nebulizer but she could not afford it.  Patient did stop smoking, unfortunately she waited till this Monday to do it.  Today is Sunday.  In any event, her husband still smokes around her.  Patient has had no fever, the cough is chronically productive of "green sputum" has been for months.  Patient does have a history of COPD.  She states she has some discomfort in her chest when she coughs.  No personal family history of PE or DVT no leg swelling no exertional pain as she describes at this time.  Is not having pain at this moment.  Unless she coughs.  Patient has no other symptoms.  She is audibly wheezing.     Past Medical History:  Diagnosis Date  . Asthma   . COPD (chronic obstructive pulmonary disease) (HCC)   . Hypercholesteremia   . Hypertension     Patient Active Problem List   Diagnosis Date Noted  . Community acquired pneumonia 08/19/2015  . HTN (hypertension) 08/19/2015  . COPD (chronic obstructive pulmonary disease) (HCC) 08/19/2015  . Dyslipidemia 08/19/2015  . Tobacco abuse 08/19/2015  . Pneumonia 08/19/2015    No past surgical history on file.  Prior to Admission medications   Medication Sig Start Date End Date Taking? Authorizing Provider  acetaminophen (TYLENOL) 500 MG tablet Take 1,000 mg by mouth every 6 (six) hours as needed for mild pain, fever or headache.    [provider]  albuterol (PROVENTIL HFA)  108 (90 Base) MCG/ACT inhaler Inhale 2 puffs into the lungs every 4 (four) hours as needed for wheezing or shortness of breath. 02/15/17   Sharman Cheek, MD  albuterol (PROVENTIL HFA;VENTOLIN HFA) 108 (90 Base) MCG/ACT inhaler Inhale 2 puffs into the lungs every 6 (six) hours as needed for wheezing or shortness of breath.    [provider]  albuterol (PROVENTIL) (2.5 MG/3ML) 0.083% nebulizer solution Take 3 mLs (2.5 mg total) by nebulization every 4 (four) hours as needed for wheezing or shortness of breath. 03/06/18   Irean Hong, MD  aspirin EC 81 MG tablet Take 81 mg by mouth daily.    [provider]  azithromycin (ZITHROMAX) 250 MG tablet One tablet daily for three more days 08/22/15   Alford Highland, MD  beclomethasone (QVAR) 40 MCG/ACT inhaler Inhale 2 puffs into the lungs 2 (two) times daily.    [provider]  cefUROXime (CEFTIN) 500 MG tablet Take 1 tablet (500 mg total) by mouth 2 (two) times daily with a meal. 08/22/15   Renae Gloss, Richard, MD  chlorpheniramine-HYDROcodone (TUSSIONEX PENNKINETIC ER) 10-8 MG/5ML SUER Take 5 mLs by mouth every 12 (twelve) hours as needed for cough.    [provider]  Coenzyme Q10 (COQ10) 100 MG CAPS Take 100 mg by mouth daily.    [provider]  doxycycline (VIBRAMYCIN) 100 MG capsule Take 1 capsule (100 mg total) by mouth 2 (two) times daily. 02/15/17  Sharman Cheek, MD  hydrochlorothiazide (MICROZIDE) 12.5 MG capsule Take 12.5 mg by mouth daily.    [provider]  losartan (COZAAR) 50 MG tablet Take 50 mg by mouth daily.    [provider]  Nebulizers (COMPRESSOR/NEBULIZER) MISC 1 Units by Does not apply route every 4 (four) hours as needed. 03/06/18   Irean Hong, MD  nicotine (NICODERM CQ - DOSED IN MG/24 HOURS) 14 mg/24hr patch Place 1 patch (14 mg total) onto the skin daily. 08/22/15   Alford Highland, MD  omeprazole (PRILOSEC) 20 MG capsule Take 20 mg by mouth daily.    [provider]  predniSONE (DELTASONE) 20 MG tablet 3 tablets PO qd x 4 days 03/06/18   Irean Hong, MD  simvastatin (ZOCOR) 20 MG tablet Take 20 mg by mouth at bedtime.    [provider]    Allergies Aspirin  Family History  Problem Relation Age of Onset  . Breast cancer Neg Hx     Social History Social History   Tobacco Use  . Smoking status: Heavy Tobacco Smoker  . Smokeless tobacco: Never Used  Substance Use Topics  . Alcohol use: Yes  . Drug use: No    Review of Systems Constitutional: No fever/chills Eyes: No visual changes. ENT: No sore throat. No stiff neck no neck pain Cardiovascular: + chest pain. Respiratory: + shortness of breath. Gastrointestinal:   no vomiting.  No diarrhea.  No constipation. Genitourinary: Negative for dysuria. Musculoskeletal: Negative lower extremity swelling Skin: Negative for rash. Neurological: Negative for severe headaches, focal weakness or numbness.   ____________________________________________   PHYSICAL EXAM:  VITAL SIGNS: ED Triage Vitals  Enc Vitals Group     BP      Pulse      Resp      Temp      Temp src      SpO2      Weight      Height      Head Circumference      Peak Flow      Pain Score      Pain Loc      Pain Edu?      Excl. in GC?     Constitutional: Alert and oriented.,  Can be heard wheezing from the door.  However able to speak in full sentences Eyes: Conjunctivae are normal Head: Atraumatic HEENT: No congestion/rhinnorhea. Mucous membranes are moist.  Oropharynx non-erythematous Neck:   Nontender with no meningismus, no masses, no stridor Cardiovascular: Normal rate, regular rhythm. Grossly normal heart sounds.  Good peripheral circulation. Respiratory: Increased work of breathing, diffuse wheezes in all fields no rales or rhonchi Abdominal: Soft and nontender. No distention. No guarding no rebound Back:  There is no focal tenderness or step off.  there is no midline tenderness  there are no lesions noted. there is no CVA tenderness  Musculoskeletal: No lower extremity tenderness, no upper extremity tenderness. No joint effusions, no DVT signs strong distal pulses no edema Neurologic:  Normal speech and language. No gross focal neurologic deficits are appreciated.  Skin:  Skin is warm, dry and intact. No rash noted. Psychiatric: Mood and affect are normal. Speech and behavior are normal.  ____________________________________________   LABS (all labs ordered are listed, but only abnormal results are displayed)  Labs Reviewed  CBC WITH DIFFERENTIAL/PLATELET  BASIC METABOLIC PANEL  TROPONIN I    Pertinent labs  results that were available during my care of the patient were  reviewed by me and considered in my medical decision making (see chart for details). ____________________________________________  EKG  I personally interpreted any EKGs ordered by me or triage Sinus rhythm rate 91 bpm, normal axis, nonspecific ST changes no acute ST elevation or depression ____________________________________________  RADIOLOGY  Pertinent labs & imaging results that were available during my care of the patient were reviewed by me and considered in my medical decision making (see chart for details). If possible, patient and/or family made aware of any abnormal findings.  No results found. ____________________________________________    PROCEDURES  Procedure(s) performed: None  Procedures  Critical Care performed: CRITICAL CARE Performed by: Jeanmarie Plant   Total critical care time: 38 minutes  Critical care time was exclusive of separately billable procedures and treating other patients.  Critical care was necessary to treat or prevent imminent or life-threatening deterioration.  Critical care was time spent personally by me on the following activities: development of treatment plan with patient and/or surrogate as well as nursing, discussions with  consultants, evaluation of patient's response to treatment, examination of patient, obtaining history from patient or surrogate, ordering and performing treatments and interventions, ordering and review of laboratory studies, ordering and review of radiographic studies, pulse oximetry and re-evaluation of patient's condition.   ____________________________________________   INITIAL IMPRESSION / ASSESSMENT AND PLAN / ED COURSE  Pertinent labs & imaging results that were available during my care of the patient were reviewed by me and considered in my medical decision making (see chart for details).  Patient here with cough and wheeze audible wheezing appreciated from across the room, we are giving her continuous nebulizers to see if that helps.  She is been able to be compliant with nebulizer treatments at home, she is using albuterol with limited success.  This is been getting progressively worse and she was seen here 5 days ago and gradually worse for several months.  We will likely have to admit her as she is unable to manage this at home.  I do not see any evidence for PE.  We will get troponin to rule out ACS as it certainly is possible in this chronic smoker, but I do not see any evidence that she needs to go directly to the Cath Lab.  We will keep her on the monitor, and see how she does with routine treatment for COPD.  If we see signs of acute infection on her chest x-ray we can treat it but she is been having productive cough now despite several episodes of treatment since 5 months ago by her account and I am not certain acute antibiotics are going to be indicated.  This is not a antibiotics and was unable to get the prednisone filled either she states because the pharmacy would not fill it.  ----------------------------------------- 9:31 AM on 03/11/2018 -----------------------------------------  The patient to the hospitalist service, still with wheeze, but still able to speak in full  sentences.  No evidence of pneumonia.  We will give her more nebulizers and see if she continues to clear.  Did discuss with the hospitalist service appreciate their consult they will admit.  We will also try antibiotics after discussion with the hospitalist as she has not been on antibiotics for several months. Pt declines to be stuck for cx and hospitalist does not feel they are warranted   ____________________________________________   FINAL CLINICAL IMPRESSION(S) / ED DIAGNOSES  Final diagnoses:  None      This chart was dictated using voice  recognition software.  Despite best efforts to proofread,  errors can occur which can change meaning.      Jeanmarie PlantMcShane, James A, MD 03/11/18 [id number]    Jeanmarie PlantMcShane, James A, MD 03/11/18 [id number]    Jeanmarie PlantMcShane, James A, MD 03/11/18 [id number]    Jeanmarie PlantMcShane, James A, MD 03/11/18 [id number]    Jeanmarie PlantMcShane, James A, MD 03/11/18 1015

## 2018-03-11 NOTE — ED Triage Notes (Signed)
Pt reports that she was here Monday for shortness of breath and since then has gotten worse - she did not fill medication that she was rx'd on that visit - she arrives short of breath with wheezing throughout

## 2018-03-12 DIAGNOSIS — J441 Chronic obstructive pulmonary disease with (acute) exacerbation: Secondary | ICD-10-CM | POA: Diagnosis not present

## 2018-03-12 LAB — BASIC METABOLIC PANEL
Anion gap: 9 (ref 5–15)
BUN: 12 mg/dL (ref 8–23)
CO2: 24 mmol/L (ref 22–32)
Calcium: 9.2 mg/dL (ref 8.9–10.3)
Chloride: 108 mmol/L (ref 98–111)
Creatinine, Ser: 1.08 mg/dL — ABNORMAL HIGH (ref 0.44–1.00)
GFR calc Af Amer: >60 mL/min (ref >60)
GFR calc non Af Amer: 54 mL/min — ABNORMAL LOW (ref >60)
Glucose, Bld: 143 mg/dL — ABNORMAL HIGH (ref 70–99)
Potassium: 3.6 mmol/L (ref 3.5–5.1)
Sodium: 141 mmol/L (ref 135–145)

## 2018-03-12 LAB — CBC
HCT: 35.7 % — ABNORMAL LOW (ref 36.0–46.0)
Hemoglobin: 11.1 g/dL — ABNORMAL LOW (ref 12.0–15.0)
MCH: 24.9 pg — ABNORMAL LOW (ref 26.0–34.0)
MCHC: 31.1 g/dL (ref 30.0–36.0)
MCV: 80.2 fL (ref 80.0–100.0)
Platelets: 370 10*3/uL (ref 150–400)
RBC: 4.45 MIL/uL (ref 3.87–5.11)
RDW: 15.7 % — ABNORMAL HIGH (ref 11.5–15.5)
WBC: 15.5 10*3/uL — AB (ref 4.0–10.5)
nRBC: 0 % (ref 0.0–0.2)

## 2018-03-12 MED ORDER — LOSARTAN POTASSIUM 50 MG PO TABS
50.0000 mg | ORAL_TABLET | Freq: Once | ORAL | Status: AC
  Administered 2018-03-12: 50 mg via ORAL
  Filled 2018-03-12: qty 1

## 2018-03-12 MED ORDER — METHYLPREDNISOLONE SODIUM SUCC 125 MG IJ SOLR
60.0000 mg | Freq: Two times a day (BID) | INTRAMUSCULAR | Status: DC
  Administered 2018-03-13 – 2018-03-14 (×2): 60 mg via INTRAVENOUS
  Filled 2018-03-12 (×2): qty 2

## 2018-03-12 MED ORDER — LOSARTAN POTASSIUM 50 MG PO TABS
100.0000 mg | ORAL_TABLET | Freq: Every day | ORAL | Status: DC
  Administered 2018-03-13 – 2018-03-14 (×2): 100 mg via ORAL
  Filled 2018-03-12 (×2): qty 2

## 2018-03-12 MED ORDER — IPRATROPIUM-ALBUTEROL 0.5-2.5 (3) MG/3ML IN SOLN
3.0000 mL | Freq: Four times a day (QID) | RESPIRATORY_TRACT | Status: DC
  Administered 2018-03-12 – 2018-03-14 (×7): 3 mL via RESPIRATORY_TRACT
  Filled 2018-03-12 (×7): qty 3

## 2018-03-12 NOTE — Care Management Note (Signed)
Case Management Note  Patient Details  Name: Susan GeorgiaLizzie B Ellwood MRN: [id number] Date of Birth: May 07, 1950  Subjective/Objective:   Admitted to [facility] with the diagnosis of COPD. Lives alone. Son is Lisbeth PlyJulius [phone]). [facility] to [facility] physician care and medications.  Gets a yearly physical per  [facility]. No skilled facility. No home oxygen. Uses inhalers in the home. Takes care of all basic activities of daily living herself.  Family will transport.   Ambulatory in the room. Hard of hearing.             Action/Plan: Requested nebulizer when discharged.  Will continue to follow for transition of care.    Expected Discharge Date:  03/13/18               Expected Discharge Plan:     In-House Referral:   yes  Discharge planning Services   yes  Post Acute Care Choice:    Choice offered to:     DME Arranged:    DME Agency:     HH Arranged:    HH Agency:     Status of Service:     If discussed at [employer] of [employer] Meetings, dates discussed:    Additional Comments:  Gwenette GreetBrenda S Jefry Lesinski, RN MSN CCM Care Management [phone] 03/12/2018, 2:20 PM

## 2018-03-12 NOTE — Progress Notes (Signed)
[facility] [place] [facility]   PATIENT NAME: Susan Jacobs    MR#:  [id number]  DATE OF BIRTH:  19-Mar-1950  SUBJECTIVE:  CHIEF COMPLAINT:   Chief Complaint  Patient presents with  . Shortness of Breath  . Chest Pain    No new complaints this morning.  Patient reports improvement in shortness of breath already.  Sister at bedside.  No chest pain.  No fevers overnight.  REVIEW OF SYSTEMS:  Review of Systems  Constitutional: Negative for chills and fever.  HENT: Negative for ear discharge and hearing loss.   Eyes: Negative for blurred vision and double vision.  Respiratory: Positive for cough.        Shortness of breath and wheezing improving.  Cardiovascular: Negative for chest pain and palpitations.  Gastrointestinal: Negative for heartburn and nausea.  Genitourinary: Negative for dysuria and urgency.  Musculoskeletal: Negative for neck pain.  Skin: Negative for itching and rash.  Neurological: Negative for dizziness and headaches.  Psychiatric/Behavioral: Negative for depression and hallucinations.    DRUG ALLERGIES:   Allergies  Allergen Reactions  . Aspirin Palpitations and Other (See Comments)    Pt states that she is able to use the lower dose.     VITALS:  Blood pressure (!) 168/75, pulse 98, temperature 98.2 F (36.8 C), temperature source Oral, resp. rate 18, height 5\' 6"  (1.676 m), weight 72.6 kg, SpO2 96 %. PHYSICAL EXAMINATION:   Physical Exam  Constitutional: She is oriented to person, place, and time and well-developed, well-nourished, and in no distress.  HENT:  Head: Normocephalic and atraumatic.  Eyes: Pupils are equal, round, and reactive to light. Conjunctivae and EOM are normal.  Neck: Normal range of motion. Neck supple. No thyromegaly present.  Cardiovascular: Normal rate and regular rhythm.  Pulmonary/Chest: Effort normal and breath sounds normal. No respiratory distress.  Abdominal: Soft. Bowel sounds are normal. She  exhibits no distension. There is no abdominal tenderness.  Musculoskeletal: Normal range of motion.        General: No edema.  Neurological: She is alert and oriented to person, place, and time.  Skin: Skin is warm. She is not diaphoretic. No erythema.  Psychiatric: Affect and judgment normal.   LABORATORY PANEL:  Female CBC Recent Labs  Lab 03/12/18 0416  WBC 15.5*  HGB 11.1*  HCT 35.7*  PLT 370   ------------------------------------------------------------------------------------------------------------------ Chemistries  Recent Labs  Lab 03/06/18 0322  03/12/18 0416  NA 139   < > 141  K 3.6   < > 3.6  CL 105   < > 108  CO2 25   < > 24  GLUCOSE 114*   < > 143*  BUN 12   < > 12  CREATININE 1.27*   < > 1.08*  CALCIUM 9.0   < > 9.2  AST 20  --   --   ALT 14  --   --   ALKPHOS 125  --   --   BILITOT 0.5  --   --    < > = values in this interval not displayed.   RADIOLOGY:  No results found. ASSESSMENT AND PLAN:   1. Acute COPD exacerbation due to bronchitis. Patient already on IV Solu-Medrol.  Initiated gradual tapering from 60 mg every 6 hourly to 60 mg every 12 hourly.  Continue break in a biotics with levofloxacin.  Nebulizer therapy.  Monitor clinically  2. Hypertension continue hydrochlorothiazide and losartan.  3. Hyperlipidemia Continue simvastatin.  4.  Hypokalemia Replaced  5.Active smoking Patient claims to have quit smoking within the last 1 week.  Encouraged to avoid going back to smoking.  DVT prophylaxis; heparin    All the records are reviewed and case discussed with Care Management/Social Worker. Management plans discussed with the patient, family and they are in agreement.  CODE STATUS: Full Code  TOTAL TIME TAKING CARE OF THIS PATIENT: 36 minutes.   More than 50% of the time was spent in counseling/coordination of care: YES  POSSIBLE D/C IN 2 DAYS, DEPENDING ON CLINICAL CONDITION.   Susan Jacobs M.D on 03/12/2018 at 1:37  PM  Between 7am to 6pm - Pager - [phone]  After 6pm go to [email or web address] - Social research officer, [email or web address] [email or web address]  [facility]  Office  [phone]  CC: Primary care physician; Clinic, General Medical  Note: This dictation was prepared with Dragon dictation along with smaller phrase technology. Any transcriptional errors that result from this process are unintentional.

## 2018-03-13 DIAGNOSIS — J441 Chronic obstructive pulmonary disease with (acute) exacerbation: Secondary | ICD-10-CM | POA: Diagnosis not present

## 2018-03-13 LAB — BASIC METABOLIC PANEL
Anion gap: 7 (ref 5–15)
BUN: 19 mg/dL (ref 8–23)
CO2: 24 mmol/L (ref 22–32)
Calcium: 9.3 mg/dL (ref 8.9–10.3)
Chloride: 110 mmol/L (ref 98–111)
Creatinine, Ser: 1.33 mg/dL — ABNORMAL HIGH (ref 0.44–1.00)
GFR calc Af Amer: 48 mL/min — ABNORMAL LOW (ref >60)
GFR calc non Af Amer: 42 mL/min — ABNORMAL LOW (ref >60)
Glucose, Bld: 102 mg/dL — ABNORMAL HIGH (ref 70–99)
Potassium: 3.8 mmol/L (ref 3.5–5.1)
Sodium: 141 mmol/L (ref 135–145)

## 2018-03-13 LAB — HIV ANTIBODY (ROUTINE TESTING W REFLEX): HIV Screen 4th Generation wRfx: NONREACTIVE

## 2018-03-13 LAB — MAGNESIUM: Magnesium: 2.2 mg/dL (ref 1.7–2.4)

## 2018-03-13 MED ORDER — SODIUM CHLORIDE 0.9 % IV SOLN
INTRAVENOUS | Status: DC
  Administered 2018-03-13 (×2): via INTRAVENOUS

## 2018-03-13 NOTE — Progress Notes (Signed)
[facility] [place] at [facility]   PATIENT NAME: Susan Jacobs    MR#:  [id number]  DATE OF BIRTH:  [birth year]  SUBJECTIVE:  CHIEF COMPLAINT:   Chief Complaint  Patient presents with  . Shortness of Breath  . Chest Pain    No new complaints this morning.  Patient reports significant improvement in shortness of breath.  Not feeling well enough to go home today however.  Patient ambulating on the medical floor.  No fevers.  Still has some wheezing.   REVIEW OF SYSTEMS:  Review of Systems  Constitutional: Negative for chills and fever.  HENT: Negative for ear discharge and hearing loss.   Eyes: Negative for blurred vision and double vision.  Respiratory: Positive for cough.        Shortness of breath and wheezing improving.  Cardiovascular: Negative for chest pain and palpitations.  Gastrointestinal: Negative for heartburn and nausea.  Genitourinary: Negative for dysuria and urgency.  Musculoskeletal: Negative for neck pain.  Skin: Negative for itching and rash.  Neurological: Negative for dizziness and headaches.  Psychiatric/Behavioral: Negative for depression and hallucinations.    DRUG ALLERGIES:   Allergies  Allergen Reactions  . Aspirin Palpitations and Other (See Comments)    Pt states that she is able to use the lower dose.     VITALS:  Blood pressure 137/71, pulse 90, temperature 97.9 F (36.6 C), temperature source Oral, resp. rate 19, height 5\' 6"  (1.676 m), weight 72.6 kg, SpO2 97 %. PHYSICAL EXAMINATION:   Physical Exam  Constitutional: She is oriented to person, place, and time and well-developed, well-nourished, and in no distress.  HENT:  Head: Normocephalic and atraumatic.  Eyes: Pupils are equal, round, and reactive to light. Conjunctivae and EOM are normal.  Neck: Normal range of motion. Neck supple. No thyromegaly present.  Cardiovascular: Normal rate and regular rhythm.  Pulmonary/Chest: Effort normal. No respiratory  distress.  Mild wheeze bilaterally  Abdominal: Soft. Bowel sounds are normal. She exhibits no distension. There is no abdominal tenderness.  Musculoskeletal: Normal range of motion.        General: No edema.  Neurological: She is alert and oriented to person, place, and time.  Skin: Skin is warm. She is not diaphoretic. No erythema.  Psychiatric: Affect and judgment normal.   LABORATORY PANEL:  Female CBC Recent Labs  Lab 03/12/18 0416  WBC 15.5*  HGB 11.1*  HCT 35.7*  PLT 370   ------------------------------------------------------------------------------------------------------------------ Chemistries  Recent Labs  Lab 03/13/18 0644  NA 141  K 3.8  CL 110  CO2 24  GLUCOSE 102*  BUN 19  CREATININE 1.33*  CALCIUM 9.3  MG 2.2   RADIOLOGY:  No results found. ASSESSMENT AND PLAN:   1. Acute COPD exacerbation due to bronchitis. Patient already on IV Solu-Medrol.  Initiated gradual tapering from 60 mg every 6 hourly to 60 mg every 12 hourly recently.  Continue antibiotics with levofloxacin.  Nebulizer therapy.  Monitor clinically  2. Hypertension continue hydrochlorothiazide and losartan.  3. Hyperlipidemia Continue simvastatin.  4. Hypokalemia Replaced  5.Active smoking Patient claims to have quit smoking within the last 1 week.  Encouraged to avoid going back to smoking.  DVT prophylaxis; heparin    All the records are reviewed and case discussed with Care Management/Social Worker. Management plans discussed with the patient, family and they are in agreement.  CODE STATUS: Full Code  TOTAL TIME TAKING CARE OF THIS PATIENT: 36 minutes.   More than 50% of  the time was spent in counseling/coordination of care: YES  POSSIBLE D/C IN AM DEPENDING ON CLINICAL CONDITION.   Nivin Braniff M.D on 03/13/2018 at 11:21 AM  Between 7am to 6pm - Pager - [phone]  After 6pm go to [email or web address] - Social research officer, governmentpassword [email or web address]  [facility] [place] [facility]  Office   [phone]  CC: Primary care physician; Clinic, General Medical  Note: This dictation was prepared with Dragon dictation along with smaller phrase technology. Any transcriptional errors that result from this process are unintentional.

## 2018-03-13 NOTE — Care Management Obs Status (Signed)
MEDICARE OBSERVATION STATUS NOTIFICATION   Patient Details  Name: Susan Jacobs MRN: [id number] Date of Birth: [birth year]   Medicare Observation Status Notification Given:  Yes    Eber Hong, RN 03/13/2018, 3:15 PM

## 2018-03-13 NOTE — Care Management CC44 (Signed)
Condition Code 44 Documentation Completed  Patient Details  Name: Susan Jacobs MRN: [id number] Date of Birth: 11-23-1950   Condition Code 44 given:  Yes Patient signature on Condition Code 44 notice:  Yes Documentation of 2 MD's agreement:  Yes Code 44 added to claim:  Yes    Eber Hong, RN 03/13/2018, 3:19 PM

## 2018-03-14 DIAGNOSIS — J441 Chronic obstructive pulmonary disease with (acute) exacerbation: Secondary | ICD-10-CM | POA: Diagnosis not present

## 2018-03-14 LAB — BASIC METABOLIC PANEL
Anion gap: 6 (ref 5–15)
BUN: 19 mg/dL (ref 8–23)
CO2: 23 mmol/L (ref 22–32)
CREATININE: 1.21 mg/dL — AB (ref 0.44–1.00)
Calcium: 8.9 mg/dL (ref 8.9–10.3)
Chloride: 109 mmol/L (ref 98–111)
GFR calc Af Amer: 54 mL/min — ABNORMAL LOW (ref >60)
GFR calc non Af Amer: 47 mL/min — ABNORMAL LOW (ref >60)
Glucose, Bld: 138 mg/dL — ABNORMAL HIGH (ref 70–99)
Potassium: 4.9 mmol/L (ref 3.5–5.1)
Sodium: 138 mmol/L (ref 135–145)

## 2018-03-14 LAB — MAGNESIUM: Magnesium: 2.2 mg/dL (ref 1.7–2.4)

## 2018-03-14 MED ORDER — LOSARTAN POTASSIUM 100 MG PO TABS: 100.0000 mg | ORAL_TABLET | Freq: Every day | ORAL | 0 refills | Status: DC

## 2018-03-14 MED ORDER — TIOTROPIUM BROMIDE MONOHYDRATE 18 MCG IN CAPS: 18.0000 ug | ORAL_CAPSULE | Freq: Every day | RESPIRATORY_TRACT | 0 refills | Status: DC

## 2018-03-14 MED ORDER — GUAIFENESIN 100 MG/5ML PO SOLN: 5.0000 mL | ORAL | 0 refills | Status: DC | PRN

## 2018-03-14 MED ORDER — LEVOFLOXACIN 250 MG PO TABS: 250.0000 mg | ORAL_TABLET | Freq: Every day | ORAL | 0 refills | Status: DC

## 2018-03-14 MED ORDER — PREDNISONE 10 MG PO TABS: ORAL_TABLET | ORAL | 0 refills | Status: DC

## 2018-03-14 NOTE — Progress Notes (Signed)
Patient reports that she is on the combo losartan/hctz. Spoke with dr. Enid Baas about his dose recommendations and he stated that she should continue her home regimen that she was on prior to admission which is the losartan/hctz combo pill 12.5mg /50mg . This information was communicated to patient at discharge and she verbalized understanding.

## 2018-03-14 NOTE — Discharge Summary (Signed)
[facility] [place] at [facility]   PATIENT NAME: Susan Jacobs    MR#:  [id number]  DATE OF BIRTH:  1950-05-23  DATE OF ADMISSION:  03/11/2018   ADMITTING PHYSICIAN: Altamese Dilling, MD  DATE OF DISCHARGE: 03/14/2018  PRIMARY CARE PHYSICIAN: Clinic, General Medical   ADMISSION DIAGNOSIS:  COPD exacerbation (HCC) [J44.1] DISCHARGE DIAGNOSIS:  Active Problems:   COPD with acute exacerbation (HCC)   COPD exacerbation (HCC)  SECONDARY DIAGNOSIS:   Past Medical History:  Diagnosis Date  . Asthma   . COPD (chronic obstructive pulmonary disease) (HCC)   . Hypercholesteremia   . Hypertension    HOSPITAL COURSE:  Chief complaint; shortness of breath and chest pain  HISTORY OF PRESENT ILLNESS: Susan Jacobs  is a 68 y.o. female with a known history of COPD, hypercholesterolemia, hypertension-was in the emergency room twice in last few weeks for worsening of COPD.  She was given nebulizer treatment and IV steroids and discharged home.  She was not given any antibiotics.  She continued to feel worsening shortness of breath so came back to emergency room.  Noted to have significant wheezing.  ER gave steroid and nebulizer treatment and gave to hospitalist team for further management of her symptoms as her wheezing continued.  Please refer to the H&P dictated for further details  HOSPITAL COURSE; 1. Acute COPD exacerbation due to bronchitis. Patient was treated with IV Solu-Medrol which was being gradually tapered.  Treated with empiric antibiotics with levofloxacin.  Nebulizer treatments.  Patient being discharged on p.o. prednisone taper with levofloxacin for a few days.  Continue nebulizer treatment at home.  Patient reports having nebulizer treatment but was not given the prescription for the nebulizer machine previously.  Patient significantly improved clinically.  Ambulating around the medical floor with no shortness of breath.  Prescription for nebulizer  machine given.    2. Hypertension continue hydrochlorothiazide and losartan.  Dose of losartan was increased for better blood pressure control.  3. Hyperlipidemia Continue simvastatin.  4. Hypokalemia Replaced  5.Active smoking Patient claims to have quit smoking within the last 1 week.  Encouraged to avoid going back to smoking.    DISCHARGE CONDITIONS:  Stable CONSULTS OBTAINED:   DRUG ALLERGIES:   Allergies  Allergen Reactions  . Aspirin Palpitations and Other (See Comments)    Pt states that she is able to use the lower dose.     DISCHARGE MEDICATIONS:   Allergies as of 03/14/2018      Reactions   Aspirin Palpitations, Other (See Comments)   Pt states that she is able to use the lower dose.        Medication List    STOP taking these medications   azithromycin 250 MG tablet Commonly known as:  ZITHROMAX   cefUROXime 500 MG tablet Commonly known as:  CEFTIN   doxycycline 100 MG capsule Commonly known as:  VIBRAMYCIN     TAKE these medications   acetaminophen 500 MG tablet Commonly known as:  TYLENOL Take 1,000 mg by mouth every 6 (six) hours as needed for mild pain, fever or headache.   albuterol 108 (90 Base) MCG/ACT inhaler Commonly known as:  PROVENTIL HFA Inhale 2 puffs into the lungs every 4 (four) hours as needed for wheezing or shortness of breath.   albuterol (2.5 MG/3ML) 0.083% nebulizer solution Commonly known as:  PROVENTIL Take 3 mLs (2.5 mg total) by nebulization every 4 (four) hours as needed for wheezing or shortness of breath.  aspirin EC 81 MG tablet Take 81 mg by mouth daily.   beclomethasone 40 MCG/ACT inhaler Commonly known as:  QVAR Inhale 2 puffs into the lungs 2 (two) times daily.   Compressor/Nebulizer Misc 1 Units by Does not apply route every 4 (four) hours as needed.   CoQ10 100 MG Caps Take 100 mg by mouth daily.   guaiFENesin 100 MG/5ML Soln Commonly known as:  ROBITUSSIN Take 5 mLs (100 mg total) by mouth  every 4 (four) hours as needed for cough or to loosen phlegm.   hydrochlorothiazide 12.5 MG capsule Commonly known as:  MICROZIDE Take 12.5 mg by mouth daily.   levofloxacin 250 MG tablet Commonly known as:  LEVAQUIN Take 1 tablet (250 mg total) by mouth daily.   losartan 100 MG tablet Commonly known as:  COZAAR Take 1 tablet (100 mg total) by mouth daily. What changed:    medication strength  how much to take  when to take this   nicotine 14 mg/24hr patch Commonly known as:  NICODERM CQ - dosed in mg/24 hours Place 1 patch (14 mg total) onto the skin daily.   omeprazole 20 MG capsule Commonly known as:  PRILOSEC Take 20 mg by mouth daily.   predniSONE 10 MG tablet Commonly known as:  DELTASONE Take 40 mg p.o. daily x2 days Then 30 mg p.o. daily x2 days Then 20 mg p.o. daily x2 days And then 10 mg p.o. daily x2 days What changed:    medication strength  additional instructions   simvastatin 20 MG tablet Commonly known as:  ZOCOR Take 20 mg by mouth at bedtime.   tiotropium 18 MCG inhalation capsule Commonly known as:  SPIRIVA Place 1 capsule (18 mcg total) into inhaler and inhale daily.            Durable Medical Equipment  (From admission, onward)         Start     Ordered   03/14/18 0000  DME Nebulizer machine    Comments:  Case manager to assist patient with getting nebulizer machine.  Question:  Patient needs a nebulizer to treat with the following condition  Answer:  COPD (chronic obstructive pulmonary disease) (HCC)   03/14/18 0948           DISCHARGE INSTRUCTIONS:   DIET:  Cardiac diet DISCHARGE CONDITION:  Stable ACTIVITY:  Activity as tolerated OXYGEN:  Home Oxygen: No.  Oxygen Delivery: room air DISCHARGE LOCATION:  home   If you experience worsening of your admission symptoms, develop shortness of breath, life threatening emergency, suicidal or homicidal thoughts you must seek medical attention immediately by calling 911  or calling your MD immediately  if symptoms less severe.  You Must read complete instructions/literature along with all the possible adverse reactions/side effects for all the Medicines you take and that have been prescribed to you. Take any new Medicines after you have completely understood and accpet all the possible adverse reactions/side effects.   Please note  You were cared for by a hospitalist during your hospital stay. If you have any questions about your discharge medications or the care you received while you were in the hospital after you are discharged, you can call the unit and asked to speak with the hospitalist on call if the hospitalist that took care of you is not available. Once you are discharged, your primary care physician will handle any further medical issues. Please note that NO REFILLS for any discharge medications will be authorized once  you are discharged, as it is imperative that you return to your primary care physician (or establish a relationship with a primary care physician if you do not have one) for your aftercare needs so that they can reassess your need for medications and monitor your lab values.    On the day of Discharge:  VITAL SIGNS:  Blood pressure 137/81, pulse 79, temperature (!) 97.5 F (36.4 C), temperature source Oral, resp. rate 16, height 5\' 6"  (1.676 m), weight 72.6 kg, SpO2 99 %. PHYSICAL EXAMINATION:  GENERAL:  68 y.o.-year-old patient lying in the bed with no acute distress.  EYES: Pupils equal, round, reactive to light and accommodation. No scleral icterus. Extraocular muscles intact.  HEENT: Head atraumatic, normocephalic. Oropharynx and nasopharynx clear.  NECK:  Supple, no jugular venous distention. No thyroid enlargement, no tenderness.  LUNGS: Normal breath sounds bilaterally, no wheezing, rales,rhonchi or crepitation. No use of accessory muscles of respiration.  CARDIOVASCULAR: S1, S2 normal. No murmurs, rubs, or gallops.  ABDOMEN:  Soft, non-tender, non-distended. Bowel sounds present. No organomegaly or mass.  EXTREMITIES: No pedal edema, cyanosis, or clubbing.  NEUROLOGIC: Cranial nerves II through XII are intact. Muscle strength 5/5 in all extremities. Sensation intact. Gait not checked.  PSYCHIATRIC: The patient is alert and oriented x 3.  SKIN: No obvious rash, lesion, or ulcer.  DATA REVIEW:   CBC Recent Labs  Lab 03/12/18 0416  WBC 15.5*  HGB 11.1*  HCT 35.7*  PLT 370    Chemistries  Recent Labs  Lab 03/14/18 0322  NA 138  K 4.9  CL 109  CO2 23  GLUCOSE 138*  BUN 19  CREATININE 1.21*  CALCIUM 8.9  MG 2.2     Microbiology Results  Results for orders placed or performed during the hospital encounter of 08/19/15  Blood Culture (routine x 2)     Status: None   Collection Time: 08/19/15  8:39 PM  Result Value Ref Range Status   Specimen Description BLOOD RIGHT ASSIST CONTROL  Final   Special Requests   Final    BOTTLES DRAWN AEROBIC AND ANAEROBIC  AERO 2CC ANA 1CC   Culture NO GROWTH 5 DAYS  Final   Report Status 08/24/2015 FINAL  Final  Blood Culture (routine x 2)     Status: Abnormal   Collection Time: 08/19/15  8:44 PM  Result Value Ref Range Status   Specimen Description BLOOD LEFT ASSIST CONTROL  Final   Special Requests   Final    BOTTLES DRAWN AEROBIC AND ANAEROBIC  AERO 3CC ANA 1CC   Culture  Setup Time   Final    GRAM POSITIVE COCCI IN BOTH AEROBIC AND ANAEROBIC BOTTLES CRITICAL RESULT CALLED TO, READ BACK BY AND VERIFIED WITH: JASON ROBBINS 08/20/15 1432     Culture STREPTOCOCCUS PNEUMONIAE (A)  Final   Report Status 08/22/2015 FINAL  Final   Organism ID, Bacteria STREPTOCOCCUS PNEUMONIAE  Final      Susceptibility   Streptococcus pneumoniae - MIC*    ERYTHROMYCIN <=0.12 SENSITIVE Sensitive     LEVOFLOXACIN 0.5 SENSITIVE Sensitive     PENICILLIN Value in next row Sensitive      SENSITIVE<=0.06    CEFTRIAXONE Value in next row Sensitive      SENSITIVE<=0.12    *  STREPTOCOCCUS PNEUMONIAE  Blood Culture ID Panel (Reflexed)     Status: Abnormal   Collection Time: 08/19/15  8:44 PM  Result Value Ref Range Status   Enterococcus species NOT DETECTED NOT DETECTED  Final   Vancomycin resistance NOT DETECTED NOT DETECTED Final   Listeria monocytogenes NOT DETECTED NOT DETECTED Final   Staphylococcus species NOT DETECTED NOT DETECTED Final   Staphylococcus aureus (BCID) NOT DETECTED NOT DETECTED Final   Methicillin resistance NOT DETECTED NOT DETECTED Final   Streptococcus species DETECTED (A) NOT DETECTED Final    Comment: CRITICAL RESULT CALLED TO, READ BACK BY AND VERIFIED WITH: JASON ROBBINS 08/20/15 1432 SGD    Streptococcus agalactiae NOT DETECTED NOT DETECTED Final   Streptococcus pneumoniae DETECTED (A) NOT DETECTED Final    Comment: CRITICAL RESULT CALLED TO, READ BACK BY AND VERIFIED WITH: JASON ROBBINS 08/20/15 1432 SGD    Streptococcus pyogenes NOT DETECTED NOT DETECTED Final   Acinetobacter baumannii NOT DETECTED NOT DETECTED Final   Enterobacteriaceae species NOT DETECTED NOT DETECTED Final   Enterobacter cloacae complex NOT DETECTED NOT DETECTED Final   Escherichia coli NOT DETECTED NOT DETECTED Final   Klebsiella oxytoca NOT DETECTED NOT DETECTED Final   Klebsiella pneumoniae NOT DETECTED NOT DETECTED Final   Proteus species NOT DETECTED NOT DETECTED Final   Serratia marcescens NOT DETECTED NOT DETECTED Final   Carbapenem resistance NOT DETECTED NOT DETECTED Final   Haemophilus influenzae NOT DETECTED NOT DETECTED Final   Neisseria meningitidis NOT DETECTED NOT DETECTED Final   Pseudomonas aeruginosa NOT DETECTED NOT DETECTED Final   Candida albicans NOT DETECTED NOT DETECTED Final   Candida glabrata NOT DETECTED NOT DETECTED Final   Candida krusei NOT DETECTED NOT DETECTED Final   Candida parapsilosis NOT DETECTED NOT DETECTED Final   Candida tropicalis NOT DETECTED NOT DETECTED Final  Urine culture     Status: Abnormal   Collection  Time: 08/19/15 10:02 PM  Result Value Ref Range Status   Specimen Description URINE, RANDOM  Final   Special Requests NONE  Final   Culture MULTIPLE SPECIES PRESENT, SUGGEST RECOLLECTION (A)  Final   Report Status 08/21/2015 FINAL  Final    RADIOLOGY:  No results found.   Management plans discussed with the patient, family and they are in agreement.  CODE STATUS: Full Code   TOTAL TIME TAKING CARE OF THIS PATIENT: 34 minutes.    Luva Metzger M.D on 03/14/2018 at 11:16 AM  Between 7am to 6pm - Pager - [phone]  After 6pm go to [email or web address] - Social research officer, government  [facility] [place] [facility]  Office  [phone]  CC: Primary care physician; Clinic, General Medical   Note: This dictation was prepared with Dragon dictation along with smaller phrase technology. Any transcriptional errors that result from this process are unintentional.

## 2018-03-14 NOTE — Care Management (Signed)
Discharge to home today per Dr. Enid Baas. Home nebulizer ordered per [facility]. Family/friend will transport Gwenette Greet RN MSN CCM Care Management [phone]

## 2018-12-12 ENCOUNTER — Other Ambulatory Visit: Payer: Self-pay

## 2018-12-12 ENCOUNTER — Encounter: Payer: Self-pay | Admitting: Emergency Medicine

## 2018-12-12 ENCOUNTER — Emergency Department
Admission: EM | Admit: 2018-12-12 | Discharge: 2018-12-12 | Disposition: A | Discharge status: Home / Self Care | Payer: Medicare Other | Attending: Emergency Medicine | Admitting: Emergency Medicine

## 2018-12-12 ENCOUNTER — Emergency Department: Payer: Medicare Other

## 2018-12-12 DIAGNOSIS — J449 Chronic obstructive pulmonary disease, unspecified: Secondary | ICD-10-CM | POA: Diagnosis not present

## 2018-12-12 DIAGNOSIS — M542 Cervicalgia: Secondary | ICD-10-CM | POA: Diagnosis present

## 2018-12-12 DIAGNOSIS — Z87891 Personal history of nicotine dependence: Secondary | ICD-10-CM | POA: Insufficient documentation

## 2018-12-12 DIAGNOSIS — Z79899 Other long term (current) drug therapy: Secondary | ICD-10-CM | POA: Diagnosis not present

## 2018-12-12 DIAGNOSIS — Z7982 Long term (current) use of aspirin: Secondary | ICD-10-CM | POA: Insufficient documentation

## 2018-12-12 DIAGNOSIS — I10 Essential (primary) hypertension: Secondary | ICD-10-CM | POA: Insufficient documentation

## 2018-12-12 MED ORDER — LIDOCAINE 5 % EX PTCH
1.0000 | MEDICATED_PATCH | CUTANEOUS | Status: DC
  Administered 2018-12-12: 1 via TRANSDERMAL
  Filled 2018-12-12: qty 1

## 2018-12-12 MED ORDER — BACLOFEN 5 MG PO TABS: 5.0000 mg | ORAL_TABLET | Freq: Three times a day (TID) | ORAL | 0 refills | Status: DC | PRN

## 2018-12-12 MED ORDER — LIDOCAINE 5 % EX PTCH: 1.0000 | MEDICATED_PATCH | CUTANEOUS | 0 refills | Status: DC
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
## 2018-12-12 NOTE — ED Notes (Signed)
C-Collar applied by this RN.  

## 2018-12-18 ENCOUNTER — Encounter: Payer: Self-pay | Admitting: *Deleted

## 2018-12-18 ENCOUNTER — Encounter: Payer: Self-pay | Admitting: Physician Assistant

## 2018-12-26 ENCOUNTER — Other Ambulatory Visit: Payer: Self-pay | Admitting: Physician Assistant

## 2018-12-26 DIAGNOSIS — M79604 Pain in right leg: Secondary | ICD-10-CM

## 2019-01-01 ENCOUNTER — Other Ambulatory Visit: Payer: Self-pay | Admitting: Physician Assistant

## 2019-01-01 ENCOUNTER — Ambulatory Visit
Admission: RE | Admit: 2019-01-01 | Discharge: 2019-01-01 | Disposition: A | Discharge status: Home / Self Care | Payer: Medicare Other | Source: Ambulatory Visit | Attending: Physician Assistant | Admitting: Physician Assistant

## 2019-01-01 ENCOUNTER — Other Ambulatory Visit: Payer: Self-pay

## 2019-01-01 DIAGNOSIS — M79604 Pain in right leg: Secondary | ICD-10-CM | POA: Diagnosis present

## 2019-01-01 DIAGNOSIS — M79605 Pain in left leg: Secondary | ICD-10-CM | POA: Insufficient documentation

## 2019-01-01 DIAGNOSIS — Z1231 Encounter for screening mammogram for malignant neoplasm of breast: Secondary | ICD-10-CM

## 2019-01-29 ENCOUNTER — Other Ambulatory Visit: Payer: Self-pay

## 2019-01-29 ENCOUNTER — Encounter: Payer: Self-pay | Admitting: Emergency Medicine

## 2019-01-29 DIAGNOSIS — Z7982 Long term (current) use of aspirin: Secondary | ICD-10-CM | POA: Diagnosis not present

## 2019-01-29 DIAGNOSIS — J449 Chronic obstructive pulmonary disease, unspecified: Secondary | ICD-10-CM | POA: Insufficient documentation

## 2019-01-29 DIAGNOSIS — Z79899 Other long term (current) drug therapy: Secondary | ICD-10-CM | POA: Insufficient documentation

## 2019-01-29 DIAGNOSIS — Z87891 Personal history of nicotine dependence: Secondary | ICD-10-CM | POA: Diagnosis not present

## 2019-01-29 DIAGNOSIS — E860 Dehydration: Secondary | ICD-10-CM | POA: Insufficient documentation

## 2019-01-29 DIAGNOSIS — R531 Weakness: Secondary | ICD-10-CM | POA: Diagnosis not present

## 2019-01-29 DIAGNOSIS — R197 Diarrhea, unspecified: Secondary | ICD-10-CM | POA: Insufficient documentation

## 2019-01-29 DIAGNOSIS — I10 Essential (primary) hypertension: Secondary | ICD-10-CM | POA: Diagnosis not present

## 2019-01-29 NOTE — ED Notes (Signed)
No answer when called several times from lobby 

## 2019-01-29 NOTE — ED Triage Notes (Signed)
Pt to triage via w/c with no distress noted, mask in place; pt reports weakness since yesterday; today having diarrhea with upper abd pain

## 2019-01-30 ENCOUNTER — Emergency Department
Admission: EM | Admit: 2019-01-30 | Discharge: 2019-01-30 | Disposition: A | Discharge status: Home / Self Care | Payer: Medicare Other | Attending: Emergency Medicine | Admitting: Emergency Medicine

## 2019-01-30 DIAGNOSIS — R531 Weakness: Secondary | ICD-10-CM

## 2019-01-30 DIAGNOSIS — E860 Dehydration: Secondary | ICD-10-CM

## 2019-01-30 LAB — URINALYSIS, COMPLETE (UACMP) WITH MICROSCOPIC
Glucose, UA: NEGATIVE mg/dL
Hgb urine dipstick: NEGATIVE
Ketones, ur: NEGATIVE mg/dL
Leukocytes,Ua: NEGATIVE
Nitrite: NEGATIVE
Protein, ur: 100 mg/dL — AB
Specific Gravity, Urine: 1.031 — ABNORMAL HIGH (ref 1.005–1.030)
pH: 5 (ref 5.0–8.0)

## 2019-01-30 LAB — CBC WITH DIFFERENTIAL/PLATELET
Abs Immature Granulocytes: 0.05 10*3/uL (ref 0.00–0.07)
Basophils Absolute: 0 10*3/uL (ref 0.0–0.1)
Basophils Relative: 0 %
Eosinophils Absolute: 0 10*3/uL (ref 0.0–0.5)
Eosinophils Relative: 0 %
HCT: 39.4 % (ref 36.0–46.0)
Hemoglobin: 12.8 g/dL (ref 12.0–15.0)
Immature Granulocytes: 0 %
Lymphocytes Relative: 7 %
Lymphs Abs: 1 10*3/uL (ref 0.7–4.0)
MCH: 25 pg — ABNORMAL LOW (ref 26.0–34.0)
MCHC: 32.5 g/dL (ref 30.0–36.0)
MCV: 76.8 fL — ABNORMAL LOW (ref 80.0–100.0)
Monocytes Absolute: 0.6 10*3/uL (ref 0.1–1.0)
Monocytes Relative: 5 %
Neutro Abs: 11.2 10*3/uL — ABNORMAL HIGH (ref 1.7–7.7)
Neutrophils Relative %: 88 %
Platelets: 366 10*3/uL (ref 150–400)
RBC: 5.13 MIL/uL — ABNORMAL HIGH (ref 3.87–5.11)
RDW: 15.5 % (ref 11.5–15.5)
WBC: 12.8 10*3/uL — ABNORMAL HIGH (ref 4.0–10.5)
nRBC: 0 % (ref 0.0–0.2)

## 2019-01-30 LAB — COMPREHENSIVE METABOLIC PANEL
ALT: 19 U/L (ref 0–44)
AST: 25 U/L (ref 15–41)
Albumin: 3.3 g/dL — ABNORMAL LOW (ref 3.5–5.0)
Alkaline Phosphatase: 91 U/L (ref 38–126)
Anion gap: 12 (ref 5–15)
BUN: 12 mg/dL (ref 8–23)
CO2: 23 mmol/L (ref 22–32)
Calcium: 8.9 mg/dL (ref 8.9–10.3)
Chloride: 101 mmol/L (ref 98–111)
Creatinine, Ser: 1.57 mg/dL — ABNORMAL HIGH (ref 0.44–1.00)
GFR calc Af Amer: 39 mL/min — ABNORMAL LOW (ref >60)
GFR calc non Af Amer: 34 mL/min — ABNORMAL LOW (ref >60)
Glucose, Bld: 115 mg/dL — ABNORMAL HIGH (ref 70–99)
Potassium: 3.3 mmol/L — ABNORMAL LOW (ref 3.5–5.1)
Sodium: 136 mmol/L (ref 135–145)
Total Bilirubin: 0.5 mg/dL (ref 0.3–1.2)
Total Protein: 7.5 g/dL (ref 6.5–8.1)

## 2019-01-30 LAB — TROPONIN I (HIGH SENSITIVITY): Troponin I (High Sensitivity): 6 ng/L (ref <18)

## 2019-01-30 LAB — LIPASE, BLOOD: Lipase: 33 U/L (ref 11–51)

## 2019-01-30 MED ORDER — SODIUM CHLORIDE 0.9 % IV BOLUS
1000.0000 mL | Freq: Once | INTRAVENOUS | Status: AC
  Administered 2019-01-30: 1000 mL via INTRAVENOUS

## 2019-01-30 NOTE — Discharge Instructions (Addendum)
Please seek medical attention for any high fevers, chest pain, shortness of breath, change in behavior, persistent vomiting, bloody stool or any other new or concerning symptoms.  

## 2019-01-30 NOTE — ED Provider Notes (Signed)
[facility] Emergency Department Provider Note   ____________________________________________   I have reviewed the triage vital signs and the nursing notes.   HISTORY  Chief Complaint Weakness   History limited by: Not Limited   HPI Susan Jacobs is a 68 y.o. female who presents to the emergency department today with primary complaint for weakness. She states that this started yesterday. She says it was accompanied with diarrhea. She has had multiple episodes of diarrhea. She did try taking some oral intake today but says it would go right through her. She did not notice any blood in her diarrhea. Has had some slight amount of abdominal pain. She denies any fevers. Denies any unusual ingestion.    Records reviewed. Per medical record review patient has a history of HLD, HTN.   Past Medical History:  Diagnosis Date  . Asthma   . COPD (chronic obstructive pulmonary disease) ([place])   . Hypercholesteremia   . Hypertension     Patient Active Problem List   Diagnosis Date Noted  . COPD exacerbation ([place]) 03/11/2018  . Community acquired pneumonia 08/19/2015  . HTN (hypertension) 08/19/2015  . COPD with acute exacerbation ([place]) 08/19/2015  . Dyslipidemia 08/19/2015  . Tobacco abuse 08/19/2015  . Pneumonia 08/19/2015    History reviewed. No pertinent surgical history.  Prior to Admission medications   Medication Sig Start Date End Date Taking? Authorizing Provider  acetaminophen (TYLENOL) 500 MG tablet Take 1,000 mg by mouth every 6 (six) hours as needed for mild pain, fever or headache.    [provider]  albuterol (PROVENTIL HFA) 108 (90 Base) MCG/ACT inhaler Inhale 2 puffs into the lungs every 4 (four) hours as needed for wheezing or shortness of breath. 02/15/17   Carrie Mew, MD  albuterol (PROVENTIL) (2.5 MG/3ML) 0.083% nebulizer solution Take 3 mLs (2.5 mg total) by nebulization every 4 (four) hours as needed for wheezing or  shortness of breath. 03/06/18   Paulette Blanch, MD  aspirin EC 81 MG tablet Take 81 mg by mouth daily.    [provider]  Baclofen 5 MG TABS Take 5 mg by mouth 3 (three) times daily as needed. 12/12/18   Laban Emperor, PA-C  beclomethasone (QVAR) 40 MCG/ACT inhaler Inhale 2 puffs into the lungs 2 (two) times daily.    [provider]  Coenzyme Q10 (COQ10) 100 MG CAPS Take 100 mg by mouth daily.    [provider]  guaiFENesin (ROBITUSSIN) 100 MG/5ML SOLN Take 5 mLs (100 mg total) by mouth every 4 (four) hours as needed for cough or to loosen phlegm. 03/14/18   Stark Jock Jude, MD  hydrochlorothiazide (MICROZIDE) 12.5 MG capsule Take 12.5 mg by mouth daily.    [provider]  levofloxacin (LEVAQUIN) 250 MG tablet Take 1 tablet (250 mg total) by mouth daily. 03/14/18   Ojie, Jude, MD  lidocaine (LIDODERM) 5 % Place 1 patch onto the skin daily. Remove & Discard patch within 12 hours or as directed by MD 12/12/18   Laban Emperor, PA-C  losartan (COZAAR) 100 MG tablet Take 1 tablet (100 mg total) by mouth daily. 03/14/18   Stark Jock Jude, MD  Nebulizers (COMPRESSOR/NEBULIZER) MISC 1 Units by Does not apply route every 4 (four) hours as needed. 03/06/18   Paulette Blanch, MD  nicotine (NICODERM CQ - DOSED IN MG/24 HOURS) 14 mg/24hr patch Place 1 patch (14 mg total) onto the skin daily. Patient not taking: Reported on 03/11/2018 08/22/15   Loletha Grayer,  MD  omeprazole (PRILOSEC) 20 MG capsule Take 20 mg by mouth daily.    [provider]  predniSONE (DELTASONE) 10 MG tablet Take 40 mg p.o. daily x2 days Then 30 mg p.o. daily x2 days Then 20 mg p.o. daily x2 days And then 10 mg p.o. daily x2 days 03/14/18   Jama Flavors, MD  simvastatin (ZOCOR) 20 MG tablet Take 20 mg by mouth at bedtime.    [provider]  tiotropium (SPIRIVA) 18 MCG inhalation capsule Place 1 capsule (18 mcg total) into inhaler and inhale daily. 03/14/18   Enid Baas Jude, MD     Allergies Aspirin  Family History  Problem Relation Age of Onset  . Breast cancer Neg Hx     Social History Social History   Tobacco Use  . Smoking status: Former Smoker    Quit date: 03/05/2018    Years since quitting: 0.9  . Smokeless tobacco: Never Used  Substance Use Topics  . Alcohol use: Yes  . Drug use: No    Review of Systems Constitutional: No fever/chills Eyes: No visual changes. ENT: No sore throat. Cardiovascular: Denies chest pain. Respiratory: Denies shortness of breath. Gastrointestinal: Positive for abdominal pain and diarrhea.  Genitourinary: Negative for dysuria. Musculoskeletal: Negative for back pain. Skin: Negative for rash. Neurological: Negative for headaches, focal weakness or numbness.  ____________________________________________   PHYSICAL EXAM:  VITAL SIGNS: ED Triage Vitals [01/29/19 2344]  Enc Vitals Group     BP 109/61     Pulse Rate 99     Resp 18     Temp 99.4 F (37.4 C)     Temp Source Oral     SpO2 96 %     Weight 181 lb (82.1 kg)     Height 5\' 4"  (1.626 m)     Head Circumference      Peak Flow      Pain Score 8   Constitutional: Alert and oriented.  Eyes: Conjunctivae are normal.  ENT      Head: Normocephalic and atraumatic.      Nose: No congestion/rhinnorhea.      Mouth/Throat: Mucous membranes are moist.      Neck: No stridor. Hematological/Lymphatic/Immunilogical: No cervical lymphadenopathy. Cardiovascular: Normal rate, regular rhythm.  No murmurs, rubs, or gallops.  Respiratory: Normal respiratory effort without tachypnea nor retractions. Breath sounds are clear and equal bilaterally. No wheezes/rales/rhonchi. Gastrointestinal: Soft and non tender. No rebound. No guarding.  Genitourinary: Deferred Musculoskeletal: Normal range of motion in all extremities. No lower extremity edema. Neurologic:  Normal speech and language. No gross focal neurologic deficits are appreciated.  Skin:  Skin is warm, dry and  intact. No rash noted. Psychiatric: Mood and affect are normal. Speech and behavior are normal. Patient exhibits appropriate insight and judgment.  ____________________________________________    LABS (pertinent positives/negatives)  CBC wbc 12.8, hgb 12.8, plt 366 UA cloudy, moderate bilirubin, 100 proteine, 6-10 rbc and wbc, rare bacteria Trop hs 6 Lipase 33 CMP wnl except k 3.3, glu 115, cr 1.57, alb 3.3  ____________________________________________   EKG  I, , attending physician, personally viewed and interpreted this EKG  EKG Time: 2359 Rate: 92 Rhythm: normal sinus rhythm Axis: normal Intervals: qtc 437 QRS: narrow ST changes: no st elevation, t wave inversion v1-v4 Impression: abnormal ekg   ____________________________________________    RADIOLOGY  None  ____________________________________________   PROCEDURES  Procedures  ____________________________________________   INITIAL IMPRESSION / ASSESSMENT AND PLAN / ED COURSE  Pertinent labs &  imaging results that were available during my care of the patient were reviewed by me and considered in my medical decision making (see chart for details).   Patient presented to the emergency department today because of concerns for weakness.  Patient does have concerns that she is dehydrated and states that she has had a lot of diarrhea.  Patient's creatinine is slightly elevated over her baseline.  No signs of concerning infection.  Patient was given IV fluids and did feel significant improvement.  Will plan on discharging home. ____________________________________________   FINAL CLINICAL IMPRESSION(S) / ED DIAGNOSES  Final diagnoses:  Weakness  Dehydration     Note: This dictation was prepared with Dragon dictation. Any transcriptional errors that result from this process are unintentional     Phineas SemenGoodman, Clint Biello, MD 01/30/19 [phone]

## 2019-01-31 LAB — URINE CULTURE

## 2019-02-11 ENCOUNTER — Encounter: Payer: Self-pay | Admitting: Gastroenterology

## 2019-02-11 ENCOUNTER — Other Ambulatory Visit: Payer: Self-pay

## 2019-02-11 ENCOUNTER — Ambulatory Visit (INDEPENDENT_AMBULATORY_CARE_PROVIDER_SITE_OTHER): Payer: Medicare Other | Admitting: Gastroenterology

## 2019-02-11 VITALS — BP 141/78 | HR 91 | Temp 98.1°F | Ht 64.0 in | Wt 186.0 lb

## 2019-02-11 DIAGNOSIS — R109 Unspecified abdominal pain: Secondary | ICD-10-CM

## 2019-02-11 NOTE — Patient Instructions (Addendum)
CT scan is scheduled on 02/26/2019 arrive at 7:30 at [facility] Nothing to eat or drank after midnight. Please pick up the prep before the procedure

## 2019-02-11 NOTE — Progress Notes (Signed)
Susan BouillonVarnita Asyah Jacobs [address]  [address]  [place], [place] [address]  Main: [phone]  Fax: [phone]   Gastroenterology Consultation  Referring Provider:     Marya FossaLeyva, Teresa, PA-C Primary Care Physician:  [facility] Reason for Consultation:     Abdominal pain        HPI:    Chief Complaint  Patient presents with  . New Patient (Initial Visit)  . Abdominal Pain    Patient states she is having RLQ pain    Susan GeorgiaLizzie B Jacobs is a 68 y.o. y/o female referred for consultation & management  by Dr. Eli Phillipsenter, [facility].  Patient reports suprapubic pain, dull, 8/10, ongoing intermittently for the last 1 to 2 years.  Not related to bowel movements.  Has 1 formed bowel movement every morning without straining.  No blood in stool.  Was recently diagnosed with a UTI and has completed antibiotic course.  However, pain has been ongoing chronically before then.  No weight loss.  No nausea or vomiting.  No dysphagia or upper abdominal pain.  No heartburn.  Pain continues despite UTI treatment.  Has had previous colonoscopies due to family history of colon cancer.  Last 1 was in March 2017, see care everywhere.  External hemorrhoids, nodular ileal mucosa, likely lymphoid nodules, diverticulosis reported.  EGD was also done at the same time and showed medium sized hiatal hernia, otherwise normal.  Pathology showed no specific pathology in the terminal ileum biopsy.  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 Pylori.  Patient reports getting stool test by her primary care provider for colorectal cancer screening since then and states she had her last 1 month ago.  Past Medical History:  Diagnosis Date  . Asthma   . COPD (chronic obstructive pulmonary disease) (HCC)   . Hypercholesteremia   . Hypertension     History reviewed. No pertinent surgical history.  Prior to Admission medications   Medication Sig Start Date End Date Taking?  Authorizing Provider  acetaminophen (TYLENOL) 500 MG tablet Take 1,000 mg by mouth every 6 (six) hours as needed for mild pain, fever or headache.   Yes [provider]  albuterol (PROVENTIL HFA) 108 (90 Base) MCG/ACT inhaler Inhale 2 puffs into the lungs every 4 (four) hours as needed for wheezing or shortness of breath. 02/15/17  Yes Sharman CheekStafford, Phillip, MD  albuterol (PROVENTIL) (2.5 MG/3ML) 0.083% nebulizer solution Take 3 mLs (2.5 mg total) by nebulization every 4 (four) hours as needed for wheezing or shortness of breath. 03/06/18  Yes Irean HongSung, Jade J, MD  aspirin EC 81 MG tablet Take 81 mg by mouth daily.   Yes [provider]  Baclofen 5 MG TABS Take 5 mg by mouth 3 (three) times daily as needed. 12/12/18  Yes Enid DerryWagner, Ashley, PA-C  beclomethasone (QVAR) 40 MCG/ACT inhaler Inhale 2 puffs into the lungs 2 (two) times daily.   Yes [provider]  Coenzyme Q10 (COQ10) 100 MG CAPS Take 100 mg by mouth daily.   Yes [provider]  Fluticasone-Salmeterol (ADVAIR) 250-50 MCG/DOSE AEPB  02/08/19  Yes [provider]  hydrochlorothiazide (MICROZIDE) 12.5 MG capsule Take 12.5 mg by mouth daily.   Yes [provider]  lidocaine (LIDODERM) 5 % Place 1 patch onto the skin daily. Remove & Discard patch within 12 hours or as directed by MD 12/12/18  Yes Enid DerryWagner, Ashley, PA-C  losartan (COZAAR) 50 MG tablet Take 50 mg by mouth daily. 12/25/18  Yes [provider]  montelukast (SINGULAIR) 10 MG tablet Take 10 mg by mouth daily. 12/24/18  Yes [provider]  Nebulizers (COMPRESSOR/NEBULIZER) MISC 1 Units by Does not apply route every 4 (four) hours as needed. 03/06/18  Yes Paulette Blanch, MD  nicotine (NICODERM CQ - DOSED IN MG/24 HOURS) 14 mg/24hr patch Place 1 patch (14 mg total) onto the skin daily. 08/22/15  Yes Wieting, Richard, MD  omeprazole (PRILOSEC) 20 MG capsule Take 20 mg by mouth daily.   Yes [provider]  simvastatin (ZOCOR)  40 MG tablet Take 40 mg by mouth at bedtime. 12/25/18  Yes [provider]  tiotropium (SPIRIVA) 18 MCG inhalation capsule Place 1 capsule (18 mcg total) into inhaler and inhale daily. 03/14/18  Yes Otila Back, MD    Family History  Problem Relation Age of Onset  . Breast cancer Neg Hx      Social History   Tobacco Use  . Smoking status: Former Smoker    Quit date: 03/05/2018    Years since quitting: 0.9  . Smokeless tobacco: Never Used  Substance Use Topics  . Alcohol use: Yes  . Drug use: No    Allergies as of 02/11/2019 - Review Complete 02/11/2019  Allergen Reaction Noted  . Lisinopril Other (See Comments) 04/23/2014  . Aspirin Palpitations and Other (See Comments) 08/19/2015    Review of Systems:    All systems reviewed and negative except where noted in HPI.   Physical Exam:  BP (!) 141/78 (BP Location: Left Arm, Patient Position: Sitting, Cuff Size: Normal)   Pulse 91   Temp 98.1 F (36.7 C) (Oral)   Ht 5\' 4"  (1.626 m)   Wt 186 lb (84.4 kg)   BMI 31.93 kg/m  No LMP recorded. Patient is postmenopausal. Psych:  Alert and cooperative. Normal mood and affect. General:   Alert,  Well-developed, well-nourished, pleasant and cooperative in NAD Head:  Normocephalic and atraumatic. Eyes:  Sclera clear, no icterus.   Conjunctiva pink. Ears:  Normal auditory acuity. Nose:  No deformity, discharge, or lesions. Mouth:  No deformity or lesions,oropharynx pink & moist. Neck:  Supple; no masses or thyromegaly. Abdomen:  Normal bowel sounds.  No bruits.  Soft, non-tender and non-distended without masses, hepatosplenomegaly or hernias noted.  No guarding or rebound tenderness.    Msk:  Symmetrical without gross deformities. Good, equal movement & strength bilaterally. Pulses:  Normal pulses noted. Extremities:  No clubbing or edema.  No cyanosis. Neurologic:  Alert and oriented x3;  grossly normal neurologically. Skin:  Intact without significant lesions or rashes. No  jaundice. Lymph Nodes:  No significant cervical adenopathy. Psych:  Alert and cooperative. Normal mood and affect.   Labs: CBC    Component Value Date/Time   WBC 12.8 (H) 01/30/2019 0257   RBC 5.13 (H) 01/30/2019 0257   HGB 12.8 01/30/2019 0257   HGB 13.3 04/19/2013 0559   HCT 39.4 01/30/2019 0257   HCT 40.9 04/19/2013 0559   PLT 366 01/30/2019 0257   PLT 421 04/19/2013 0559   MCV 76.8 (L) 01/30/2019 0257   MCV 80 04/19/2013 0559   MCH 25.0 (L) 01/30/2019 0257   MCHC 32.5 01/30/2019 0257   RDW 15.5 01/30/2019 0257   RDW 16.0 (H) 04/19/2013 0559   LYMPHSABS 1.0 01/30/2019 0257   LYMPHSABS 1.4 04/19/2013 0559   MONOABS 0.6 01/30/2019 0257   MONOABS 1.0 (H) 04/19/2013 0559   EOSABS 0.0 01/30/2019 0257   EOSABS 0.0 04/19/2013 0559  BASOSABS 0.0 01/30/2019 0257   BASOSABS 0.1 04/19/2013 0559   CMP     Component Value Date/Time   NA 136 01/29/2019 2353   NA 136 04/18/2013 0636   K 3.3 (L) 01/29/2019 2353   K 4.0 04/18/2013 0636   CL 101 01/29/2019 2353   CL 104 04/18/2013 0636   CO2 23 01/29/2019 2353   CO2 26 04/18/2013 0636   GLUCOSE 115 (H) 01/29/2019 2353   GLUCOSE 107 (H) 04/18/2013 0636   BUN 12 01/29/2019 2353   BUN 17 04/18/2013 0636   CREATININE 1.57 (H) 01/29/2019 2353   CREATININE 1.28 04/18/2013 0636   CALCIUM 8.9 01/29/2019 2353   CALCIUM 9.4 04/18/2013 0636   PROT 7.5 01/29/2019 2353   PROT 7.4 04/17/2013 1019   ALBUMIN 3.3 (L) 01/29/2019 2353   ALBUMIN 2.4 (L) 04/17/2013 1019   AST 25 01/29/2019 2353   AST 38 (H) 04/17/2013 1019   ALT 19 01/29/2019 2353   ALT 12 04/17/2013 1019   ALKPHOS 91 01/29/2019 2353   ALKPHOS 116 04/17/2013 1019   BILITOT 0.5 01/29/2019 2353   BILITOT 0.4 04/17/2013 1019   GFRNONAA 34 (L) 01/29/2019 2353   GFRNONAA 45 (L) 04/18/2013 0636   GFRAA 39 (L) 01/29/2019 2353   GFRAA 53 (L) 04/18/2013 0636    Imaging Studies: No results found.  Assessment and Plan:   Susan Jacobs is a 68 y.o. y/o female has been  referred for suprapubic pain  Will obtain abdominal imaging due to ongoing pain for the last 1 to 2 years  Denies any constipation or relief after bowel movements  She prefers to follow-up with her PCP in regard to colorectal cancer screening and states she just had a stool test for the last month.    Dr Susan Jacobs  Speech recognition software was used to dictate the above note.

## 2019-02-26 ENCOUNTER — Ambulatory Visit
Admission: RE | Admit: 2019-02-26 | Discharge: 2019-02-26 | Disposition: A | Discharge status: Home / Self Care | Payer: Medicare Other | Source: Ambulatory Visit | Attending: Gastroenterology | Admitting: Gastroenterology

## 2019-02-26 ENCOUNTER — Other Ambulatory Visit: Payer: Self-pay

## 2019-02-26 DIAGNOSIS — R109 Unspecified abdominal pain: Secondary | ICD-10-CM

## 2019-03-08 ENCOUNTER — Ambulatory Visit: Admission: RE | Admit: 2019-03-08 | Payer: Medicare HMO | Source: Ambulatory Visit

## 2019-03-12 ENCOUNTER — Other Ambulatory Visit: Payer: Self-pay

## 2019-03-12 ENCOUNTER — Ambulatory Visit
Admission: RE | Admit: 2019-03-12 | Discharge: 2019-03-12 | Disposition: A | Discharge status: Home / Self Care | Payer: Medicare HMO | Source: Ambulatory Visit | Attending: Gastroenterology | Admitting: Gastroenterology

## 2019-03-12 ENCOUNTER — Telehealth: Payer: Self-pay

## 2019-03-12 DIAGNOSIS — R109 Unspecified abdominal pain: Secondary | ICD-10-CM | POA: Insufficient documentation

## 2019-03-12 MED ORDER — IOHEXOL 300 MG/ML  SOLN
75.0000 mL | Freq: Once | INTRAMUSCULAR | Status: AC | PRN
  Administered 2019-03-12: 75 mL via INTRAVENOUS

## 2019-03-12 NOTE — Telephone Encounter (Signed)
-----   Message from Pasty Spillers, MD sent at 03/12/2019 11:54 AM EST ----- Morrie Sheldon please let the patient know, her CT scan did not show any abnormalities in the lower abdomen where her pain is.  her pain may be musculoskeletal.  She should try hot and cold packs in the area.  If she is having any urinary complaints, she should follow-up with her primary care doctor to obtain a urine test to make sure she does not have an infection.  She does have a hiatal hernia, which is a hernia of the stomach, but this is not a cause of her symptoms.  She also has fatty liver and should try to lose weight with diet and exercise as this can help reverse it.  Clinic recall for 6 months if not already set

## 2019-03-12 NOTE — Telephone Encounter (Signed)
Patient verbalized understanding of results and put patient on a recall list

## 2019-10-11 ENCOUNTER — Other Ambulatory Visit: Payer: Self-pay | Admitting: Physician Assistant

## 2019-10-11 DIAGNOSIS — Z13820 Encounter for screening for osteoporosis: Secondary | ICD-10-CM

## 2020-06-07 ENCOUNTER — Other Ambulatory Visit: Payer: Self-pay

## 2020-06-07 ENCOUNTER — Emergency Department
Admission: EM | Admit: 2020-06-07 | Discharge: 2020-06-07 | Disposition: A | Discharge status: Home / Self Care | Payer: Medicare Other | Attending: Emergency Medicine | Admitting: Emergency Medicine

## 2020-06-07 DIAGNOSIS — Z7982 Long term (current) use of aspirin: Secondary | ICD-10-CM | POA: Insufficient documentation

## 2020-06-07 DIAGNOSIS — Z87891 Personal history of nicotine dependence: Secondary | ICD-10-CM | POA: Insufficient documentation

## 2020-06-07 DIAGNOSIS — Z7951 Long term (current) use of inhaled steroids: Secondary | ICD-10-CM | POA: Insufficient documentation

## 2020-06-07 DIAGNOSIS — I10 Essential (primary) hypertension: Secondary | ICD-10-CM | POA: Diagnosis not present

## 2020-06-07 DIAGNOSIS — R220 Localized swelling, mass and lump, head: Secondary | ICD-10-CM | POA: Diagnosis present

## 2020-06-07 DIAGNOSIS — J441 Chronic obstructive pulmonary disease with (acute) exacerbation: Secondary | ICD-10-CM | POA: Insufficient documentation

## 2020-06-07 DIAGNOSIS — K1120 Sialoadenitis, unspecified: Secondary | ICD-10-CM | POA: Insufficient documentation

## 2020-06-07 DIAGNOSIS — Z79899 Other long term (current) drug therapy: Secondary | ICD-10-CM | POA: Insufficient documentation

## 2020-06-07 DIAGNOSIS — J45909 Unspecified asthma, uncomplicated: Secondary | ICD-10-CM | POA: Insufficient documentation

## 2020-06-07 MED ORDER — CEPHALEXIN 500 MG PO CAPS
500.0000 mg | ORAL_CAPSULE | Freq: Once | ORAL | Status: AC
  Administered 2020-06-07: 500 mg via ORAL
  Filled 2020-06-07: qty 1

## 2020-06-07 MED ORDER — CEPHALEXIN 500 MG PO CAPS: 500.0000 mg | ORAL_CAPSULE | Freq: Four times a day (QID) | ORAL | 0 refills | Status: AC

## 2020-06-07 NOTE — ED Provider Notes (Signed)
[facility] EMERGENCY DEPARTMENT Provider Note   CSN: [id number] Arrival date & time: 06/07/20  1932     History Chief Complaint  Patient presents with  . Sore Throat    X 4 weeks , no swelling seen , no co of sob ,     Susan Jacobs is a 70 y.o. female presents the emerge department for evaluation of right-sided submandibular soft tissue swelling, states she had similar episode that occurred 4 weeks ago.  She was diagnosed with sialadenitis and placed on oral antibiotic and within 1 week symptoms resolved.  Patient states she massage the area of soft tissue swelling and sucked on sour candies to help with swelling.  Patient was doing well up until 2 days ago when she states the swelling started to recur.  She denies any fevers but she is starting to have the same pain and swelling.  She has not followed up with ENT.  She denies any difficulty swallowing.  No chest pain or shortness of breath.  HPI     Past Medical History:  Diagnosis Date  . Asthma   . COPD (chronic obstructive pulmonary disease) (HCC)   . Hypercholesteremia   . Hypertension     Patient Active Problem List   Diagnosis Date Noted  . COPD exacerbation (HCC) 03/11/2018  . Community acquired pneumonia 08/19/2015  . HTN (hypertension) 08/19/2015  . COPD with acute exacerbation (HCC) 08/19/2015  . Dyslipidemia 08/19/2015  . Tobacco abuse 08/19/2015  . Pneumonia 08/19/2015    History reviewed. No pertinent surgical history.   OB History   No obstetric history on file.     Family History  Problem Relation Age of Onset  . Breast cancer Neg Hx     Social History   Tobacco Use  . Smoking status: Former Smoker    Quit date: 03/05/2018    Years since quitting: 2.2  . Smokeless tobacco: Never Used  Vaping Use  . Vaping Use: Never used  Substance Use Topics  . Alcohol use: Yes  . Drug use: Yes    Comment: crack last used last weekend     Home Medications Prior to Admission  medications   Medication Sig Start Date End Date Taking? Authorizing Provider  cephALEXin (KEFLEX) 500 MG capsule Take 1 capsule (500 mg total) by mouth 4 (four) times daily for 7 days. 06/07/20 06/14/20 Yes Evon Slack, PA-C  acetaminophen (TYLENOL) 500 MG tablet Take 1,000 mg by mouth every 6 (six) hours as needed for mild pain, fever or headache.    [provider]  albuterol (PROVENTIL HFA) 108 (90 Base) MCG/ACT inhaler Inhale 2 puffs into the lungs every 4 (four) hours as needed for wheezing or shortness of breath. 02/15/17   Sharman Cheek, MD  albuterol (PROVENTIL) (2.5 MG/3ML) 0.083% nebulizer solution Take 3 mLs (2.5 mg total) by nebulization every 4 (four) hours as needed for wheezing or shortness of breath. 03/06/18   Irean Hong, MD  aspirin EC 81 MG tablet Take 81 mg by mouth daily.    [provider]  Baclofen 5 MG TABS Take 5 mg by mouth 3 (three) times daily as needed. 12/12/18   Enid Derry, PA-C  beclomethasone (QVAR) 40 MCG/ACT inhaler Inhale 2 puffs into the lungs 2 (two) times daily.    [provider]  Coenzyme Q10 (COQ10) 100 MG CAPS Take 100 mg by mouth daily.    [provider]  Fluticasone-Salmeterol (ADVAIR) 250-50 MCG/DOSE AEPB  02/08/19   [provider]  hydrochlorothiazide (MICROZIDE) 12.5 MG capsule Take 12.5 mg by mouth daily.    [provider]  lidocaine (LIDODERM) 5 % Place 1 patch onto the skin daily. Remove & Discard patch within 12 hours or as directed by MD 12/12/18   Enid Derry, PA-C  losartan (COZAAR) 50 MG tablet Take 50 mg by mouth daily. 12/25/18   [provider]  montelukast (SINGULAIR) 10 MG tablet Take 10 mg by mouth daily. 12/24/18   [provider]  Nebulizers (COMPRESSOR/NEBULIZER) MISC 1 Units by Does not apply route every 4 (four) hours as needed. 03/06/18   Irean Hong, MD  nicotine (NICODERM CQ - DOSED IN MG/24 HOURS) 14 mg/24hr patch Place 1 patch (14 mg total)  onto the skin daily. 08/22/15   Alford Highland, MD  omeprazole (PRILOSEC) 20 MG capsule Take 20 mg by mouth daily.    [provider]  simvastatin (ZOCOR) 40 MG tablet Take 40 mg by mouth at bedtime. 12/25/18   [provider]  tiotropium (SPIRIVA) 18 MCG inhalation capsule Place 1 capsule (18 mcg total) into inhaler and inhale daily. 03/14/18   Enid Baas Jude, MD    Allergies    Lisinopril and Aspirin  Review of Systems   Review of Systems  Constitutional: Negative for chills and fever.  HENT: Positive for facial swelling. Negative for sore throat and trouble swallowing.   Respiratory: Negative for shortness of breath.   Cardiovascular: Negative for chest pain.  Gastrointestinal: Negative for abdominal pain.  Skin: Negative for wound.  Neurological: Negative for facial asymmetry and headaches.    Physical Exam Updated Vital Signs BP (!) 161/61 (BP Location: Right Arm)   Pulse 80   Temp 98.3 F (36.8 C)   Resp 18   Ht 5\' 4"  (1.626 m)   Wt 85 kg   SpO2 97%   BMI 32.17 kg/m   Physical Exam Constitutional:      Appearance: She is well-developed.  HENT:     Head: Normocephalic and atraumatic.     Right Ear: Ear canal normal.     Left Ear: Ear canal normal.     Mouth/Throat:     Mouth: Mucous membranes are moist.     Pharynx: No pharyngeal swelling, oropharyngeal exudate, posterior oropharyngeal erythema or uvula swelling.     Tonsils: No tonsillar exudate or tonsillar abscesses.  Eyes:     Conjunctiva/sclera: Conjunctivae normal.  Neck:     Comments: Mild right submandibular swelling with no warmth erythema. Cardiovascular:     Rate and Rhythm: Normal rate.     Heart sounds: Normal heart sounds.  Pulmonary:     Effort: Pulmonary effort is normal. No respiratory distress.  Musculoskeletal:        General: Normal range of motion.     Cervical back: Normal range of motion.  Skin:    General: Skin is warm.     Findings: No rash.  Neurological:      Mental Status: She is alert and oriented to person, place, and time.  Psychiatric:        Behavior: Behavior normal.        Thought Content: Thought content normal.     ED Results / Procedures / Treatments   Labs (all labs ordered are listed, but only abnormal results are displayed) Labs Reviewed - No data to display  EKG None  Radiology No results found.  Procedures Procedures   Medications Ordered in ED Medications  cephALEXin (KEFLEX) capsule 500 mg (500 mg Oral Given 06/07/20 2123)    ED Course  I have reviewed the triage vital signs and the nursing notes.  Pertinent labs & imaging results that were available during my care of the patient were reviewed by me and considered in my medical decision making (see chart for details).    MDM Rules/Calculators/A&P                          70 year old female with history of right submandibular sialadenitis.  Responded well to oral antibiotics.  Symptoms resolved for nearly 2 to 3 weeks before they have returned.  Symptoms have currently been present for 2 days.  Will restart antibiotics and continue with massaging, sour candies.  Patient is encouraged to go ahead and call ENT to schedule follow-up appoint.  She understands signs symptoms return to the ER for. Final Clinical Impression(s) / ED Diagnoses Final diagnoses:  Sialadenitis    Rx / DC Orders ED Discharge Orders         Ordered    cephALEXin (KEFLEX) 500 MG capsule  4 times daily        06/07/20 2117           Ronnette Juniper 06/07/20 2129    Phineas Semen, MD 06/07/20 2217

## 2020-06-07 NOTE — Discharge Instructions (Addendum)
Please continue with milking the area of swelling, warm moist compresses, sour candies.  Take antibiotics as prescribed and call ENT office tomorrow morning to schedule follow-up.  Return to the ER for any worsening symptoms or urgent changes in your health

## 2020-06-07 NOTE — ED Triage Notes (Signed)
Pt states sore throat x 4 weeks , no other complaints

## 2020-07-03 ENCOUNTER — Other Ambulatory Visit: Payer: Self-pay | Admitting: Otolaryngology

## 2020-07-03 DIAGNOSIS — R221 Localized swelling, mass and lump, neck: Secondary | ICD-10-CM

## 2020-07-20 ENCOUNTER — Other Ambulatory Visit: Payer: Self-pay

## 2020-07-20 ENCOUNTER — Ambulatory Visit
Admission: RE | Admit: 2020-07-20 | Discharge: 2020-07-20 | Disposition: A | Discharge status: Home / Self Care | Payer: Medicare Other | Source: Ambulatory Visit | Attending: Otolaryngology | Admitting: Otolaryngology

## 2020-07-20 DIAGNOSIS — R221 Localized swelling, mass and lump, neck: Secondary | ICD-10-CM | POA: Diagnosis present

## 2020-07-20 LAB — POCT I-STAT CREATININE: Creatinine, Ser: 1.3 mg/dL — ABNORMAL HIGH (ref 0.44–1.00)

## 2020-07-20 MED ORDER — IOHEXOL 300 MG/ML  SOLN
75.0000 mL | Freq: Once | INTRAMUSCULAR | Status: AC | PRN
  Administered 2020-07-20: 75 mL via INTRAVENOUS

## 2020-11-08 ENCOUNTER — Emergency Department
Admission: EM | Admit: 2020-11-08 | Discharge: 2020-11-08 | Disposition: A | Discharge status: Home / Self Care | Payer: Medicare Other | Attending: Emergency Medicine | Admitting: Emergency Medicine

## 2020-11-08 ENCOUNTER — Other Ambulatory Visit: Payer: Self-pay

## 2020-11-08 ENCOUNTER — Emergency Department: Payer: Medicare Other

## 2020-11-08 DIAGNOSIS — R140 Abdominal distension (gaseous): Secondary | ICD-10-CM | POA: Insufficient documentation

## 2020-11-08 DIAGNOSIS — Z87891 Personal history of nicotine dependence: Secondary | ICD-10-CM | POA: Insufficient documentation

## 2020-11-08 DIAGNOSIS — R195 Other fecal abnormalities: Secondary | ICD-10-CM

## 2020-11-08 DIAGNOSIS — I10 Essential (primary) hypertension: Secondary | ICD-10-CM | POA: Insufficient documentation

## 2020-11-08 DIAGNOSIS — R1110 Vomiting, unspecified: Secondary | ICD-10-CM | POA: Diagnosis present

## 2020-11-08 DIAGNOSIS — Z20822 Contact with and (suspected) exposure to covid-19: Secondary | ICD-10-CM | POA: Insufficient documentation

## 2020-11-08 DIAGNOSIS — K2901 Acute gastritis with bleeding: Secondary | ICD-10-CM | POA: Insufficient documentation

## 2020-11-08 DIAGNOSIS — Z7982 Long term (current) use of aspirin: Secondary | ICD-10-CM | POA: Insufficient documentation

## 2020-11-08 DIAGNOSIS — Z79899 Other long term (current) drug therapy: Secondary | ICD-10-CM | POA: Diagnosis not present

## 2020-11-08 DIAGNOSIS — J441 Chronic obstructive pulmonary disease with (acute) exacerbation: Secondary | ICD-10-CM | POA: Diagnosis not present

## 2020-11-08 DIAGNOSIS — J45909 Unspecified asthma, uncomplicated: Secondary | ICD-10-CM | POA: Diagnosis not present

## 2020-11-08 DIAGNOSIS — Z7951 Long term (current) use of inhaled steroids: Secondary | ICD-10-CM | POA: Insufficient documentation

## 2020-11-08 DIAGNOSIS — K2900 Acute gastritis without bleeding: Secondary | ICD-10-CM

## 2020-11-08 LAB — COMPREHENSIVE METABOLIC PANEL
ALT: 34 U/L (ref 0–44)
AST: 37 U/L (ref 15–41)
Albumin: 3 g/dL — ABNORMAL LOW (ref 3.5–5.0)
Alkaline Phosphatase: 88 U/L (ref 38–126)
Anion gap: 8 (ref 5–15)
BUN: 28 mg/dL — ABNORMAL HIGH (ref 8–23)
CO2: 23 mmol/L (ref 22–32)
Calcium: 8.6 mg/dL — ABNORMAL LOW (ref 8.9–10.3)
Chloride: 109 mmol/L (ref 98–111)
Creatinine, Ser: 0.96 mg/dL (ref 0.44–1.00)
GFR, Estimated: >60 mL/min (ref >60)
Glucose, Bld: 111 mg/dL — ABNORMAL HIGH (ref 70–99)
Potassium: 4.4 mmol/L (ref 3.5–5.1)
Sodium: 140 mmol/L (ref 135–145)
Total Bilirubin: 0.4 mg/dL (ref 0.3–1.2)
Total Protein: 6.9 g/dL (ref 6.5–8.1)

## 2020-11-08 LAB — PROTIME-INR
INR: 1 (ref 0.8–1.2)
Prothrombin Time: 13.4 seconds (ref 11.4–15.2)

## 2020-11-08 LAB — CBC
HCT: 34.8 % — ABNORMAL LOW (ref 36.0–46.0)
Hemoglobin: 10.7 g/dL — ABNORMAL LOW (ref 12.0–15.0)
MCH: 26.9 pg (ref 26.0–34.0)
MCHC: 30.7 g/dL (ref 30.0–36.0)
MCV: 87.4 fL (ref 80.0–100.0)
Platelets: 312 10*3/uL (ref 150–400)
RBC: 3.98 MIL/uL (ref 3.87–5.11)
RDW: 15.3 % (ref 11.5–15.5)
WBC: 10.1 10*3/uL (ref 4.0–10.5)
nRBC: 0 % (ref 0.0–0.2)

## 2020-11-08 LAB — URINALYSIS, COMPLETE (UACMP) WITH MICROSCOPIC
Bacteria, UA: NONE SEEN
Bilirubin Urine: NEGATIVE
Glucose, UA: NEGATIVE mg/dL
Ketones, ur: NEGATIVE mg/dL
Leukocytes,Ua: NEGATIVE
Nitrite: NEGATIVE
Protein, ur: NEGATIVE mg/dL
Specific Gravity, Urine: 1.015 (ref 1.005–1.030)
pH: 5.5 (ref 5.0–8.0)

## 2020-11-08 LAB — SAMPLE TO BLOOD BANK

## 2020-11-08 LAB — LIPASE, BLOOD: Lipase: 47 U/L (ref 11–51)

## 2020-11-08 LAB — RESP PANEL BY RT-PCR (FLU A&B, COVID) ARPGX2
Influenza A by PCR: NEGATIVE
Influenza B by PCR: NEGATIVE
SARS Coronavirus 2 by RT PCR: NEGATIVE

## 2020-11-08 MED ORDER — ONDANSETRON 4 MG PO TBDP: 4.0000 mg | ORAL_TABLET | Freq: Three times a day (TID) | ORAL | 0 refills | Status: DC | PRN

## 2020-11-08 MED ORDER — PANTOPRAZOLE SODIUM 40 MG PO TBEC
80.0000 mg | DELAYED_RELEASE_TABLET | Freq: Once | ORAL | Status: AC
  Administered 2020-11-08: 80 mg via ORAL
  Filled 2020-11-08: qty 2

## 2020-11-08 MED ORDER — SODIUM CHLORIDE 0.9 % IV BOLUS
1000.0000 mL | Freq: Once | INTRAVENOUS | Status: AC
  Administered 2020-11-08: 1000 mL via INTRAVENOUS

## 2020-11-08 MED ORDER — ONDANSETRON HCL 4 MG/2ML IJ SOLN
4.0000 mg | Freq: Once | INTRAMUSCULAR | Status: AC
  Administered 2020-11-08: 4 mg via INTRAVENOUS
  Filled 2020-11-08: qty 2

## 2020-11-08 MED ORDER — AZITHROMYCIN 250 MG PO TABS: ORAL_TABLET | ORAL | 0 refills | Status: DC

## 2020-11-08 MED ORDER — OMEPRAZOLE MAGNESIUM 20 MG PO TBEC: 40.0000 mg | DELAYED_RELEASE_TABLET | Freq: Every day | ORAL | 0 refills | Status: DC

## 2020-11-08 NOTE — ED Notes (Signed)
Pt to ED POV c/o black emesis and stool that started this morning. Pt states she has had 3 episodes of emesis and diarrhea this morning, pt states she drank around 3-4 beers last night. Pt denies abdominal pain and recent abdominal surgeries. Pt is on blood thinners but has not had any hx of gastric bleeding. Pt has a hx of COPD and has increased SOB and cough r/t a cold. Pt took covid test yesterday at urgent clinic and states it came back negative.   Pt A&O x4, NAD

## 2020-11-08 NOTE — ED Notes (Signed)
Pt able to tolerate PO fluids at this time.  

## 2020-11-08 NOTE — ED Triage Notes (Signed)
Pt comes with c/o vomiting dark brown foul smelling fluid. Pt states last night her belly was bloated and she couldn't have a BM.

## 2020-11-08 NOTE — ED Notes (Signed)
Patient transported to X-ray 

## 2020-11-08 NOTE — ED Notes (Signed)
Lab called this RN stating the blue top is hemolyzed, this RN attempted to get blood, unable to get at this time.  Lab called to get blood.

## 2020-11-08 NOTE — ED Provider Notes (Addendum)
[facility] Emergency Department Provider Note  ____________________________________________  Time seen: Approximately 9:38 AM  I have reviewed the triage vital signs and the nursing notes.   HISTORY  Chief Complaint Emesis    HPI Susan Jacobs is a 70 y.o. female with a history of hypertension and COPD who comes ED complaining of vomiting dark brown liquid since last night associated with abdominal bloating.  Denies any abdominal pain.  She felt like she could not have a bowel movement last night but then had a normal one this morning.  She did notice that her stool was very dark this morning.  Does not take blood thinners, denies any history of GI bleeding.  No recent steroid or NSAID use.  She does report increased cough for the last few days with runny nose.  She went to urgent care where rapid antigen COVID test was negative.  Symptoms are intermittent, vomiting may have been provoked by having several alcoholic drinks last night as part of her birthday celebration.  No alleviating factors.  Denies previous abdominal surgeries  Past Medical History:  Diagnosis Date   Asthma    COPD (chronic obstructive pulmonary disease) (HCC)    Hypercholesteremia    Hypertension      Patient Active Problem List   Diagnosis Date Noted   COPD exacerbation (HCC) 03/11/2018   Community acquired pneumonia 08/19/2015   HTN (hypertension) 08/19/2015   COPD with acute exacerbation (HCC) 08/19/2015   Dyslipidemia 08/19/2015   Tobacco abuse 08/19/2015   Pneumonia 08/19/2015     History reviewed. No pertinent surgical history.   Prior to Admission medications   Medication Sig Start Date End Date Taking? Authorizing Provider  aspirin EC 81 MG tablet Take 81 mg by mouth daily.   Yes [provider]  azithromycin (ZITHROMAX Z-PAK) 250 MG tablet Take 2 tablets (500 mg) on  Day 1,  followed by 1 tablet (250 mg) once daily on Days 2 through 5. 11/08/20  Yes  Sharman CheekStafford, Anaaya Fuster, MD  beclomethasone (QVAR) 40 MCG/ACT inhaler Inhale 2 puffs into the lungs 2 (two) times daily.   Yes [provider]  Coenzyme Q10 (COQ10) 100 MG CAPS Take 100 mg by mouth daily.   Yes [provider]  Fluticasone-Salmeterol (ADVAIR) 250-50 MCG/DOSE AEPB  02/08/19  Yes [provider]  hydrochlorothiazide (MICROZIDE) 12.5 MG capsule Take 12.5 mg by mouth daily.   Yes [provider]  losartan (COZAAR) 50 MG tablet Take 50 mg by mouth daily. 12/25/18  Yes [provider]  montelukast (SINGULAIR) 10 MG tablet Take 10 mg by mouth daily. 12/24/18  Yes [provider]  omeprazole (PRILOSEC OTC) 20 MG tablet Take 2 tablets (40 mg total) by mouth daily. 11/08/20 12/08/20 Yes Sharman CheekStafford, Ty Buntrock, MD  omeprazole (PRILOSEC) 20 MG capsule Take 20 mg by mouth daily.   Yes [provider]  ondansetron (ZOFRAN ODT) 4 MG disintegrating tablet Take 1 tablet (4 mg total) by mouth every 8 (eight) hours as needed for nausea or vomiting. 11/08/20  Yes Sharman CheekStafford, Taysen Bushart, MD  simvastatin (ZOCOR) 40 MG tablet Take 40 mg by mouth at bedtime. 12/25/18  Yes [provider]  acetaminophen (TYLENOL) 500 MG tablet Take 1,000 mg by mouth every 6 (six) hours as needed for mild pain, fever or headache.    [provider]  albuterol (PROVENTIL HFA) 108 (90 Base) MCG/ACT inhaler Inhale 2 puffs into the lungs every 4 (four) hours as needed for wheezing or shortness of  breath. 02/15/17   Sharman Cheek, MD  albuterol (PROVENTIL) (2.5 MG/3ML) 0.083% nebulizer solution Take 3 mLs (2.5 mg total) by nebulization every 4 (four) hours as needed for wheezing or shortness of breath. 03/06/18   Irean Hong, MD  Baclofen 5 MG TABS Take 5 mg by mouth 3 (three) times daily as needed. Patient not taking: No sig reported 12/12/18   Enid Derry, PA-C  lidocaine (LIDODERM) 5 % Place 1 patch onto the skin daily. Remove & Discard patch within 12 hours  or as directed by MD Patient not taking: No sig reported 12/12/18   Enid Derry, PA-C  Nebulizers (COMPRESSOR/NEBULIZER) MISC 1 Units by Does not apply route every 4 (four) hours as needed. 03/06/18   Irean Hong, MD  nicotine (NICODERM CQ - DOSED IN MG/24 HOURS) 14 mg/24hr patch Place 1 patch (14 mg total) onto the skin daily. Patient not taking: Reported on 11/08/2020 08/22/15   Alford Highland, MD  tiotropium (SPIRIVA) 18 MCG inhalation capsule Place 1 capsule (18 mcg total) into inhaler and inhale daily. Patient not taking: No sig reported 03/14/18   Jama Flavors, MD     Allergies Lisinopril, Nsaids, and Aspirin   Family History  Problem Relation Age of Onset   Breast cancer Neg Hx     Social History Social History   Tobacco Use   Smoking status: Former    Types: Cigarettes    Quit date: 03/05/2018    Years since quitting: 2.6   Smokeless tobacco: Never  Vaping Use   Vaping Use: Never used  Substance Use Topics   Alcohol use: Yes   Drug use: Yes    Comment: crack last used last weekend     Review of Systems  Constitutional:   No fever or chills.  ENT:   No sore throat. No rhinorrhea. Cardiovascular:   No chest pain or syncope. Respiratory:   No dyspnea or cough. Gastrointestinal:   Negative for abdominal pain, positive vomiting and dark stool. Musculoskeletal:   Negative for focal pain or swelling All other systems reviewed and are negative except as documented above in ROS and HPI.  ____________________________________________   PHYSICAL EXAM:  VITAL SIGNS: ED Triage Vitals  Enc Vitals Group     BP 11/08/20 0915 (!) 142/73     Pulse Rate 11/08/20 0915 99     Resp 11/08/20 0915 18     Temp 11/08/20 0915 98 F (36.7 C)     Temp src --      SpO2 11/08/20 0915 100 %     Weight --      Height --      Head Circumference --      Peak Flow --      Pain Score 11/08/20 0914 0     Pain Loc --      Pain Edu? --      Excl. in GC? --     Vital signs reviewed,  nursing assessments reviewed.   Constitutional:   Alert and oriented. Non-toxic appearance. Eyes:   Conjunctivae are normal. EOMI. PERRL. ENT      Head:   Normocephalic and atraumatic.      Nose:   Nasal congestion.      Mouth/Throat:   Dry mucous membranes.      Neck:   No meningismus. Full ROM. Hematological/Lymphatic/Immunilogical:   No cervical lymphadenopathy. Cardiovascular:   RRR. Symmetric bilateral radial and DP pulses.  No murmurs. Cap refill less than 2 seconds. Respiratory:  Normal respiratory effort without tachypnea/retractions. Breath sounds are clear and equal bilaterally. No wheezes/rales/rhonchi. Gastrointestinal:   Soft and nontender. Non distended. There is no CVA tenderness.  No rebound, rigidity, or guarding.  Rectal exam performed with nurse Zollie Scale at bedside, there is a very scant amount of stool in the rectum, Hemoccult positive.  There is a small external hemorrhoid that is not inflamed thrombosed or bleeding Genitourinary:   deferred Musculoskeletal:   Normal range of motion in all extremities. No joint effusions.  No lower extremity tenderness.  No edema. Neurologic:   Normal speech and language.  Motor grossly intact. No acute focal neurologic deficits are appreciated.  Skin:    Skin is warm, dry and intact. No rash noted.  No petechiae, purpura, or bullae.  ____________________________________________    LABS (pertinent positives/negatives) (all labs ordered are listed, but only abnormal results are displayed) Labs Reviewed  COMPREHENSIVE METABOLIC PANEL - Abnormal; Notable for the following components:      Result Value   Glucose, Bld 111 (*)    BUN 28 (*)    Calcium 8.6 (*)    Albumin 3.0 (*)    All other components within normal limits  CBC - Abnormal; Notable for the following components:   Hemoglobin 10.7 (*)    HCT 34.8 (*)    All other components within normal limits  URINALYSIS, COMPLETE (UACMP) WITH MICROSCOPIC - Abnormal; Notable for the  following components:   Hgb urine dipstick MODERATE (*)    All other components within normal limits  RESP PANEL BY RT-PCR (FLU A&B, COVID) ARPGX2  LIPASE, BLOOD  PROTIME-INR   ____________________________________________   EKG    ____________________________________________    RADIOLOGY  DG Abdomen Acute W/Chest  Result Date: 11/08/2020 CLINICAL DATA:  Cough.  Vomiting. EXAM: DG ABDOMEN ACUTE WITH 1 VIEW CHEST COMPARISON:  Chest radiographs dated 03/11/2018 and abdomen and pelvis CT dated 03/12/2019. FINDINGS: Normal sized heart. Stable moderate-sized hiatal hernia. Clear lungs. Normal bowel gas pattern without free peritoneal air. Mild thoracolumbar scoliosis. Moderate lumbar spine degenerative changes. Atheromatous arterial calcifications. IMPRESSION: No acute abnormality. Electronically Signed   By: Beckie Salts M.D.   On: 11/08/2020 10:31    ____________________________________________   PROCEDURES Procedures  ____________________________________________  DIFFERENTIAL DIAGNOSIS   Upper GI bleed, viral illness, COPD exacerbation, pneumonia, anemia, electrolyte abnormality  CLINICAL IMPRESSION / ASSESSMENT AND PLAN / ED COURSE  Medications ordered in the ED: Medications  sodium chloride 0.9 % bolus 1,000 mL (0 mLs Intravenous Stopped 11/08/20 1203)  ondansetron (ZOFRAN) injection 4 mg (4 mg Intravenous Given 11/08/20 1012)  pantoprazole (PROTONIX) EC tablet 80 mg (80 mg Oral Given 11/08/20 1110)    Pertinent labs & imaging results that were available during my care of the patient were reviewed by me and considered in my medical decision making (see chart for details).  SHELBEY SPINDLER was evaluated in Emergency Department on 11/08/2020 for the symptoms described in the history of present illness. She was evaluated in the context of the global COVID-19 pandemic, which necessitated consideration that the patient might be at risk for infection with the SARS-CoV-2 virus that  causes COVID-19. Institutional protocols and algorithms that pertain to the evaluation of patients at risk for COVID-19 are in a state of rapid change based on information released by regulatory bodies including the CDC and federal and state organizations. These policies and algorithms were followed during the patient's care in the ED.   Patient presents with vomiting and melanotic stool.  Will check labs, abdominal and chest x-ray, COVID PCR.  Clinical Course as of 11/08/20 1216  Sun Nov 08, 2020  1004 Abdominal and chest x-rays viewed and interpreted by me, lungs are clear without pleural effusion edema or infiltrate, no thorax.  Abdomen shows nonobstructive bowel gas pattern [PS]  1212 Work-up negative and reassuring, patient is tolerating oral intake.  We will continue PPI, referral to PCP and GI. [PS]    Clinical Course User Index [PS] Sharman Cheek, MD     ____________________________________________   FINAL CLINICAL IMPRESSION(S) / ED DIAGNOSES    Final diagnoses:  Occult GI bleeding  Acute gastritis without hemorrhage, unspecified gastritis type     ED Discharge Orders          Ordered    omeprazole (PRILOSEC OTC) 20 MG tablet  Daily        11/08/20 1215    ondansetron (ZOFRAN ODT) 4 MG disintegrating tablet  Every 8 hours PRN        11/08/20 1215    azithromycin (ZITHROMAX Z-PAK) 250 MG tablet        11/08/20 1215            Portions of this note were generated with dragon dictation software. Dictation errors may occur despite best attempts at proofreading.    Sharman Cheek, MD 11/08/20 7001    Sharman Cheek, MD 11/08/20 [phone]

## 2020-11-08 NOTE — ED Notes (Addendum)
EDP Stafford at bedside.  

## 2020-11-11 ENCOUNTER — Telehealth: Payer: Self-pay | Admitting: Gastroenterology

## 2020-11-17 ENCOUNTER — Other Ambulatory Visit: Payer: Self-pay | Admitting: Physician Assistant

## 2020-11-17 DIAGNOSIS — Z1231 Encounter for screening mammogram for malignant neoplasm of breast: Secondary | ICD-10-CM

## 2020-11-17 DIAGNOSIS — Z13820 Encounter for screening for osteoporosis: Secondary | ICD-10-CM

## 2020-12-24 ENCOUNTER — Other Ambulatory Visit: Payer: Self-pay

## 2020-12-24 ENCOUNTER — Ambulatory Visit
Admission: RE | Admit: 2020-12-24 | Discharge: 2020-12-24 | Disposition: A | Discharge status: Home / Self Care | Payer: Medicare Other | Source: Ambulatory Visit | Attending: Physician Assistant | Admitting: Physician Assistant

## 2020-12-24 DIAGNOSIS — Z13820 Encounter for screening for osteoporosis: Secondary | ICD-10-CM | POA: Diagnosis not present

## 2020-12-24 DIAGNOSIS — Z1231 Encounter for screening mammogram for malignant neoplasm of breast: Secondary | ICD-10-CM | POA: Insufficient documentation

## 2020-12-24 DIAGNOSIS — J449 Chronic obstructive pulmonary disease, unspecified: Secondary | ICD-10-CM | POA: Insufficient documentation

## 2020-12-24 DIAGNOSIS — Z780 Asymptomatic menopausal state: Secondary | ICD-10-CM | POA: Insufficient documentation

## 2021-01-04 ENCOUNTER — Other Ambulatory Visit: Payer: Self-pay | Admitting: Physician Assistant

## 2021-01-04 DIAGNOSIS — N6310 Unspecified lump in the right breast, unspecified quadrant: Secondary | ICD-10-CM

## 2021-01-04 DIAGNOSIS — R928 Other abnormal and inconclusive findings on diagnostic imaging of breast: Secondary | ICD-10-CM

## 2021-01-04 DIAGNOSIS — N6320 Unspecified lump in the left breast, unspecified quadrant: Secondary | ICD-10-CM

## 2021-01-28 ENCOUNTER — Ambulatory Visit (INDEPENDENT_AMBULATORY_CARE_PROVIDER_SITE_OTHER): Payer: Medicare Other | Admitting: Gastroenterology

## 2021-01-28 ENCOUNTER — Encounter: Payer: Self-pay | Admitting: Gastroenterology

## 2021-01-28 VITALS — BP 173/80 | HR 92 | Temp 98.3°F | Wt 188.0 lb

## 2021-01-28 DIAGNOSIS — D649 Anemia, unspecified: Secondary | ICD-10-CM

## 2021-01-28 DIAGNOSIS — K760 Fatty (change of) liver, not elsewhere classified: Secondary | ICD-10-CM

## 2021-01-28 MED ORDER — SUTAB 1479-225-188 MG PO TABS: ORAL_TABLET | ORAL | 0 refills | Status: DC

## 2021-01-28 NOTE — Addendum Note (Signed)
Addended by: Roena Malady on: 01/28/2021 05:21 PM   Modules accepted: Orders

## 2021-01-28 NOTE — Patient Instructions (Signed)
Fatty Liver Disease  The liver converts food into energy, removes toxic material from the blood, makes important proteins, and absorbs necessary vitamins from food. Fatty liver disease occurs when too much fat has built up in your liver cells. Fatty liverdisease is also called hepatic steatosis. In many cases, fatty liver disease does not cause symptoms or problems. It is often diagnosed when tests are being done for other reasons. However, over time, fatty liver can cause inflammation that may lead to more serious liver problems, such as scarring of the liver (cirrhosis) and liver failure. Fatty liver is associated with insulin resistance, increased body fat, high blood pressure (hypertension), and high cholesterol. These are features of metabolic syndrome and increaseyour risk for stroke, diabetes, and heart disease. What are the causes? This condition may be caused by components of metabolic syndrome: Obesity. Insulin resistance. High cholesterol. Other causes: Alcohol abuse. Poor nutrition. Cushing syndrome. Pregnancy. Certain drugs. Poisons. Some viral infections. What increases the risk? You are more likely to develop this condition if you: Abuse alcohol. Are overweight. Have diabetes. Have hepatitis. Have a high triglyceride level. Are pregnant. What are the signs or symptoms? Fatty liver disease often does not cause symptoms. If symptoms do develop, they can include: Fatigue and weakness. Weight loss. Confusion. Nausea, vomiting, or abdominal pain. Yellowing of your skin and the white parts of your eyes (jaundice). Itchy skin. How is this diagnosed? This condition may be diagnosed by: A physical exam and your medical history. Blood tests. Imaging tests, such as an ultrasound, CT scan, or MRI. A liver biopsy. A small sample of liver tissue is removed using a needle. The sample is then looked at under a microscope. How is this treated? Fatty liver disease is often  caused by other health conditions. Treatment for fatty liver may involve medicines and lifestyle changes to manage conditions such as: Alcoholism. High cholesterol. Diabetes. Being overweight or obese. Follow these instructions at home:  Do not drink alcohol. If you have trouble quitting, ask your health care provider how to safely quit with the help of medicine or a supervised program. This is important to keep your condition from getting worse. Eat a healthy diet as told by your health care provider. Ask your health care provider about working with a dietitian to develop an eating plan. Exercise regularly. This can help you lose weight and control your cholesterol and diabetes. Talk to your health care provider about an exercise plan and which activities are best for you. Take over-the-counter and prescription medicines only as told by your health care provider. Keep all follow-up visits. This is important. Contact a health care provider if: You have trouble controlling your: Blood sugar. This is especially important if you have diabetes. Cholesterol. Drinking of alcohol. Get help right away if: You have abdominal pain. You have jaundice. You have nausea and are vomiting. You vomit blood or material that looks like coffee grounds. You have stools that are black, tar-like, or bloody. Summary Fatty liver disease develops when too much fat builds up in the cells of your liver. Fatty liver disease often causes no symptoms or problems. However, over time, fatty liver can cause inflammation that may lead to more serious liver problems, such as scarring of the liver (cirrhosis). You are more likely to develop this condition if you abuse alcohol, are pregnant, are overweight, have diabetes, have hepatitis, or have high triglyceride or cholesterol levels. Contact your health care provider if you have trouble controlling your blood sugar, cholesterol,   or drinking of alcohol. This information is  not intended to replace advice given to you by your health care provider. Make sure you discuss any questions you have with your healthcare provider. Document Revised: 11/28/2019 Document Reviewed: 11/28/2019 Elsevier Patient Education  2022 Elsevier Inc.  

## 2021-01-28 NOTE — Progress Notes (Signed)
Melodie Bouillon, MD [address]  [address]  [place], [place] [address]  Main: [phone]  Fax: [phone]   Primary Care Physician: [facility]   Chief Complaint  Patient presents with   Emesis   Abdominal Pain    HPI: Susan Jacobs is a 70 y.o. female previously seen 2 years ago for abdominal pain and did not follow-up after then.  Is now being seen as she went to the ER in September 2022 for epigastric pain and coffee-ground emesis.  Patient was managed conservatively and was asked to follow-up with GI and her PCP.  Patient reports that when she went to the ER she was having nausea vomiting and diarrhea.  She describes having coffee-ground emesis at the time and melena.  Since then her bowel movements have become more normal looking states that she will intermittently still see some black in her stool but they are not liquidy black like they were when she went to the ER.  Has been more recently having dark brown stool instead.  Also reports epigastric pain intermittently, with or without meals.  Dull, 5/10, nonradiating.  Has not had any further episodes of emesis since being seen in the ER.  Has been on Prilosec since presenting the ER  Labs done in ER reveal anemia  ROS: All ROS reviewed and negative except as per HPI   Past Medical History:  Diagnosis Date   Asthma    COPD (chronic obstructive pulmonary disease) (HCC)    Hypercholesteremia    Hypertension     Past surgical history: None  Prior to Admission medications   Medication Sig Start Date End Date Taking? Authorizing Provider  acetaminophen (TYLENOL) 500 MG tablet Take 1,000 mg by mouth every 6 (six) hours as needed for mild pain, fever or headache.   Yes [provider]  albuterol (PROVENTIL HFA) 108 (90 Base) MCG/ACT inhaler Inhale 2 puffs into the lungs every 4 (four) hours as needed for wheezing or shortness of breath. 02/15/17  Yes Sharman Cheek, MD   albuterol (PROVENTIL) (2.5 MG/3ML) 0.083% nebulizer solution Take 3 mLs (2.5 mg total) by nebulization every 4 (four) hours as needed for wheezing or shortness of breath. 03/06/18  Yes Irean Hong, MD  aspirin EC 81 MG tablet Take 81 mg by mouth daily.   Yes [provider]  Baclofen 5 MG TABS Take 5 mg by mouth 3 (three) times daily as needed. 12/12/18  Yes Enid Derry, PA-C  beclomethasone (QVAR) 40 MCG/ACT inhaler Inhale 2 puffs into the lungs 2 (two) times daily.   Yes [provider]  Coenzyme Q10 (COQ10) 100 MG CAPS Take 100 mg by mouth daily.   Yes [provider]  Fluticasone-Salmeterol (ADVAIR) 250-50 MCG/DOSE AEPB  02/08/19  Yes [provider]  hydrochlorothiazide (MICROZIDE) 12.5 MG capsule Take 12.5 mg by mouth daily.   Yes [provider]  lidocaine (LIDODERM) 5 % Place 1 patch onto the skin daily. Remove & Discard patch within 12 hours or as directed by MD 12/12/18  Yes Enid Derry, PA-C  losartan (COZAAR) 50 MG tablet Take 50 mg by mouth daily. 12/25/18  Yes [provider]  montelukast (SINGULAIR) 10 MG tablet Take 10 mg by mouth daily. 12/24/18  Yes [provider]  Nebulizers (COMPRESSOR/NEBULIZER) MISC 1 Units by Does not apply route every 4 (four) hours as needed. 03/06/18  Yes Irean Hong, MD  nicotine (NICODERM CQ - DOSED IN MG/24  HOURS) 14 mg/24hr patch Place 1 patch (14 mg total) onto the skin daily. 08/22/15  Yes Wieting, Richard, MD  omeprazole (PRILOSEC) 20 MG capsule Take 20 mg by mouth daily.   Yes [provider]  ondansetron (ZOFRAN ODT) 4 MG disintegrating tablet Take 1 tablet (4 mg total) by mouth every 8 (eight) hours as needed for nausea or vomiting. 11/08/20  Yes Sharman Cheek, MD  simvastatin (ZOCOR) 40 MG tablet Take 40 mg by mouth at bedtime. 12/25/18  Yes [provider]  tiotropium (SPIRIVA) 18 MCG inhalation capsule Place 1 capsule (18 mcg total) into inhaler and inhale  daily. 03/14/18  Yes Ojie, Jude, MD  omeprazole (PRILOSEC OTC) 20 MG tablet Take 2 tablets (40 mg total) by mouth daily. 11/08/20 12/08/20  Sharman Cheek, MD    Family History  Problem Relation Age of Onset   Breast cancer Neg Hx      Social History   Tobacco Use   Smoking status: Former    Types: Cigarettes    Quit date: 03/05/2018    Years since quitting: 2.9   Smokeless tobacco: Never  Vaping Use   Vaping Use: Never used  Substance Use Topics   Alcohol use: Yes   Drug use: Yes    Comment: crack last used last weekend     Allergies as of 01/28/2021 - Review Complete 01/28/2021  Allergen Reaction Noted   Lisinopril Other (See Comments) and Hives 04/23/2014   Nsaids  04/23/2014   Aspirin Palpitations and Other (See Comments) 08/19/2015    Physical Examination:  Constitutional: General:   Alert,  Well-developed, well-nourished, pleasant and cooperative in NAD BP (!) 173/80   Pulse 92   Temp 98.3 F (36.8 C) (Oral)   Wt 188 lb (85.3 kg)   BMI 32.27 kg/m   Respiratory: Normal respiratory effort  Gastrointestinal:  Soft, non-tender and non-distended without masses, hepatosplenomegaly or hernias noted.  No guarding or rebound tenderness.     Cardiac: No clubbing or edema.  No cyanosis. Normal posterior tibial pedal pulses noted.  Psych:  Alert and cooperative. Normal mood and affect.  Musculoskeletal:  Normal gait. Head normocephalic, atraumatic. Symmetrical without gross deformities. 5/5 Lower extremity strength bilaterally.  Skin: Warm. Intact without significant lesions or rashes. No jaundice.  Neck: Supple, trachea midline  Lymph: No cervical lymphadenopathy  Psych:  Alert and oriented x3, Alert and cooperative. Normal mood and affect.  Labs: CMP     Component Value Date/Time   NA 140 11/08/2020 1005   NA 136 04/18/2013 0636   K 4.4 11/08/2020 1005   K 4.0 04/18/2013 0636   CL 109 11/08/2020 1005   CL 104 04/18/2013 0636   CO2 23 11/08/2020 1005    CO2 26 04/18/2013 0636   GLUCOSE 111 (H) 11/08/2020 1005   GLUCOSE 107 (H) 04/18/2013 0636   BUN 28 (H) 11/08/2020 1005   BUN 17 04/18/2013 0636   CREATININE 0.96 11/08/2020 1005   CREATININE 1.28 04/18/2013 0636   CALCIUM 8.6 (L) 11/08/2020 1005   CALCIUM 9.4 04/18/2013 0636   PROT 6.9 11/08/2020 1005   PROT 7.4 04/17/2013 1019   ALBUMIN 3.0 (L) 11/08/2020 1005   ALBUMIN 2.4 (L) 04/17/2013 1019   AST 37 11/08/2020 1005   AST 38 (H) 04/17/2013 1019   ALT 34 11/08/2020 1005   ALT 12 04/17/2013 1019   ALKPHOS 88 11/08/2020 1005   ALKPHOS 116 04/17/2013 1019   BILITOT 0.4 11/08/2020 1005   BILITOT 0.4 04/17/2013 1019  GFRNONAA >60 11/08/2020 1005   GFRNONAA 45 (L) 04/18/2013 0636   GFRAA 39 (L) 01/29/2019 2353   GFRAA 53 (L) 04/18/2013 0636   Lab Results  Component Value Date   WBC 10.1 11/08/2020   HGB 10.7 (L) 11/08/2020   HCT 34.8 (L) 11/08/2020   MCV 87.4 11/08/2020   PLT 312 11/08/2020    Imaging Studies: CT abdomen pelvis January 2021 with no acute findings within the abdomen or pelvis Stable hepatic steatosis reported  Assessment and Plan:   Susan Jacobs is a 70 y.o. y/o female who went to the ER recently for epigastric pain, coffee-ground emesis and also reported nausea vomiting diarrhea at the time, which possible melena at the time to here for follow-up  Given acute anemia noted on 11/08/2020 labs, and patient describing coffee-ground emesis and melena at the time, would recommend EGD and colonoscopy at this time.  Patient has not had a colonoscopy in the past, therefore important to rule out malignancy as well given her symptoms and anemia  Recheck hemoglobin and obtain ferritin and iron panel  I have discussed alternative options, risks & benefits,  which include, but are not limited to, bleeding, infection, perforation,respiratory complication & drug reaction.  The patient agrees with this plan & written consent will be obtained.    Avoid NSAID use such  as Ibuprofen, Aleeve, advil, motrin, BC and Goodie powder, Naproxen, Meloxicam and others.   Will obtain fatty liver work-up at this time to due to hepatic steatosis noted on previous imaging  Finding of fatty liver on imaging discussed with patient Diet, weight loss, and exercise encouraged along with avoiding hepatotoxic drugs including alcohol Risk of progression to cirrhosis if above measures are not instituted were discussed as well, and patient verbalized understanding    Dr Melodie Bouillon

## 2021-01-29 LAB — CBC
Hematocrit: 30.4 % — ABNORMAL LOW (ref 34.0–46.6)
Hemoglobin: 9.4 g/dL — ABNORMAL LOW (ref 11.1–15.9)
MCH: 24.9 pg — ABNORMAL LOW (ref 26.6–33.0)
MCHC: 30.9 g/dL — ABNORMAL LOW (ref 31.5–35.7)
MCV: 80 fL (ref 79–97)
Platelets: 381 10*3/uL (ref 150–450)
RBC: 3.78 x10E6/uL (ref 3.77–5.28)
RDW: 16.8 % — ABNORMAL HIGH (ref 11.7–15.4)
WBC: 8.8 10*3/uL (ref 3.4–10.8)

## 2021-01-29 LAB — IRON AND TIBC
Iron Saturation: 4 % — CL (ref 15–55)
Iron: 14 ug/dL — ABNORMAL LOW (ref 27–139)
Total Iron Binding Capacity: 341 ug/dL (ref 250–450)
UIBC: 327 ug/dL (ref 118–369)

## 2021-01-29 LAB — FERRITIN: Ferritin: 23 ng/mL (ref 15–150)

## 2021-02-04 ENCOUNTER — Telehealth: Payer: Self-pay | Admitting: Gastroenterology

## 2021-02-04 NOTE — Telephone Encounter (Signed)
Inbound call from pharmacist Jasmin stating that the pt's sutab needs a prior auth. Please advise. Thank you.

## 2021-02-09 ENCOUNTER — Telehealth: Payer: Self-pay | Admitting: Gastroenterology

## 2021-02-09 NOTE — Telephone Encounter (Signed)
Inbound call from pt requesting a call back to r/s her appt that's for 02/11/21. Thank you.

## 2021-02-10 NOTE — Telephone Encounter (Signed)
Pt called back again to cxl her procedure that's on 02/11/21.Pt needs to r/s her appt.

## 2021-02-10 NOTE — Telephone Encounter (Signed)
[id number]. SUTAB TAB is approved through 02/27/2022

## 2021-02-11 NOTE — Telephone Encounter (Signed)
I spoke to pt and have rescheduled her procedure for 03/02/21 with Dr Tobi Bastos  Pt states she has her paperwork and understands instructions, also has picked up [facility] for prep

## 2021-02-11 NOTE — Addendum Note (Signed)
Addended by: Roena Malady on: 02/11/2021 11:51 AM   Modules accepted: Orders

## 2021-02-16 ENCOUNTER — Encounter: Payer: Self-pay | Admitting: *Deleted

## 2021-02-25 ENCOUNTER — Telehealth: Payer: Self-pay | Admitting: *Deleted

## 2021-02-25 NOTE — Telephone Encounter (Signed)
Contacted NP prior to her office visit with Dr. Donneta Romberg tomorrow. Patient confirmed that she would be coming to the NP apt tomorrow as planned. Reviewed directions to the cancer center with the patient. She thanked me for calling her.

## 2021-02-26 ENCOUNTER — Other Ambulatory Visit: Payer: Self-pay

## 2021-02-26 ENCOUNTER — Encounter: Payer: Self-pay | Admitting: Internal Medicine

## 2021-02-26 ENCOUNTER — Inpatient Hospital Stay: Payer: Medicare Other

## 2021-02-26 ENCOUNTER — Inpatient Hospital Stay: Payer: Medicare Other | Attending: Internal Medicine | Admitting: Internal Medicine

## 2021-02-26 DIAGNOSIS — Z800 Family history of malignant neoplasm of digestive organs: Secondary | ICD-10-CM

## 2021-02-26 DIAGNOSIS — Z87891 Personal history of nicotine dependence: Secondary | ICD-10-CM | POA: Diagnosis not present

## 2021-02-26 DIAGNOSIS — R1110 Vomiting, unspecified: Secondary | ICD-10-CM | POA: Diagnosis not present

## 2021-02-26 DIAGNOSIS — M549 Dorsalgia, unspecified: Secondary | ICD-10-CM

## 2021-02-26 DIAGNOSIS — Z888 Allergy status to other drugs, medicaments and biological substances status: Secondary | ICD-10-CM | POA: Diagnosis not present

## 2021-02-26 DIAGNOSIS — Z79899 Other long term (current) drug therapy: Secondary | ICD-10-CM

## 2021-02-26 DIAGNOSIS — Z808 Family history of malignant neoplasm of other organs or systems: Secondary | ICD-10-CM

## 2021-02-26 DIAGNOSIS — R5383 Other fatigue: Secondary | ICD-10-CM

## 2021-02-26 DIAGNOSIS — D649 Anemia, unspecified: Secondary | ICD-10-CM | POA: Diagnosis not present

## 2021-02-26 DIAGNOSIS — F1290 Cannabis use, unspecified, uncomplicated: Secondary | ICD-10-CM | POA: Diagnosis not present

## 2021-02-26 DIAGNOSIS — Z886 Allergy status to analgesic agent status: Secondary | ICD-10-CM

## 2021-02-26 LAB — CBC WITH DIFFERENTIAL/PLATELET
Abs Immature Granulocytes: 0.03 10*3/uL (ref 0.00–0.07)
Basophils Absolute: 0.1 10*3/uL (ref 0.0–0.1)
Basophils Relative: 1 %
Eosinophils Absolute: 0.3 10*3/uL (ref 0.0–0.5)
Eosinophils Relative: 3 %
HCT: 35 % — ABNORMAL LOW (ref 36.0–46.0)
Hemoglobin: 10.8 g/dL — ABNORMAL LOW (ref 12.0–15.0)
Immature Granulocytes: 0 %
Lymphocytes Relative: 22 %
Lymphs Abs: 1.9 10*3/uL (ref 0.7–4.0)
MCH: 25 pg — ABNORMAL LOW (ref 26.0–34.0)
MCHC: 30.9 g/dL (ref 30.0–36.0)
MCV: 81 fL (ref 80.0–100.0)
Monocytes Absolute: 0.6 10*3/uL (ref 0.1–1.0)
Monocytes Relative: 7 %
Neutro Abs: 5.8 10*3/uL (ref 1.7–7.7)
Neutrophils Relative %: 67 %
Platelets: 442 10*3/uL — ABNORMAL HIGH (ref 150–400)
RBC: 4.32 MIL/uL (ref 3.87–5.11)
RDW: 18.8 % — ABNORMAL HIGH (ref 11.5–15.5)
WBC: 8.8 10*3/uL (ref 4.0–10.5)
nRBC: 0 % (ref 0.0–0.2)

## 2021-02-26 LAB — TECHNOLOGIST SMEAR REVIEW: Plt Morphology: INCREASED

## 2021-02-26 LAB — IRON AND TIBC
Iron: 33 ug/dL (ref 28–170)
Saturation Ratios: 8 % — ABNORMAL LOW (ref 10.4–31.8)
TIBC: 428 ug/dL (ref 250–450)
UIBC: 395 ug/dL

## 2021-02-26 LAB — COMPREHENSIVE METABOLIC PANEL
ALT: 25 U/L (ref 0–44)
AST: 30 U/L (ref 15–41)
Albumin: 3.6 g/dL (ref 3.5–5.0)
Alkaline Phosphatase: 96 U/L (ref 38–126)
Anion gap: 11 (ref 5–15)
BUN: 13 mg/dL (ref 8–23)
CO2: 24 mmol/L (ref 22–32)
Calcium: 9.1 mg/dL (ref 8.9–10.3)
Chloride: 100 mmol/L (ref 98–111)
Creatinine, Ser: 1.23 mg/dL — ABNORMAL HIGH (ref 0.44–1.00)
GFR, Estimated: 48 mL/min — ABNORMAL LOW (ref >60)
Glucose, Bld: 100 mg/dL — ABNORMAL HIGH (ref 70–99)
Potassium: 3.4 mmol/L — ABNORMAL LOW (ref 3.5–5.1)
Sodium: 135 mmol/L (ref 135–145)
Total Bilirubin: <0.1 mg/dL — ABNORMAL LOW (ref 0.3–1.2)
Total Protein: 7.8 g/dL (ref 6.5–8.1)

## 2021-02-26 LAB — FERRITIN: Ferritin: 21 ng/mL (ref 11–307)

## 2021-02-26 LAB — RETICULOCYTES
Immature Retic Fract: 31 % — ABNORMAL HIGH (ref 2.3–15.9)
RBC.: 4.33 MIL/uL (ref 3.87–5.11)
Retic Count, Absolute: 59.8 10*3/uL (ref 19.0–186.0)
Retic Ct Pct: 1.4 % (ref 0.4–3.1)

## 2021-02-26 LAB — LACTATE DEHYDROGENASE: LDH: 137 U/L (ref 98–192)

## 2021-02-26 LAB — FOLATE: Folate: 18.8 ng/mL (ref >5.9)

## 2021-02-26 LAB — VITAMIN B12: Vitamin B-12: 349 pg/mL (ref 180–914)

## 2021-02-26 NOTE — Assessment & Plan Note (Addendum)
#  Anemia hemoglobin around 9; saturation 4%; however ferritin 21- ?  Question iron deficiency results.  Recommend starting oral iron over-the-counter after her endoscopies on Jan 3rd.  Patient is fearful of needles/wants to continue oral iron at this time.   #I had Long discussion with the patient and family regarding multiple etiologies of anemia including iron deficiency/nutritional deficiency /chronic kidney disease chronic inflammation.  Await EGD/colonoscopy as planned by GI  Jan 3rd.   # Check CBC CMP LDH CRP haptoglobin; iron studies/ferritin; myeloma panel kappa lambda light chain; review of peripheral smear.   Thank you, Tahiliani for allowing me to participate in the care of your pleasant patient. Please do not hesitate to contact me with questions or concerns in the interim.  # DISPOSITION: # labs today-ordered # follow up in 6 weeks-:MD; labs- cbc;- Dr.B

## 2021-02-26 NOTE — Progress Notes (Signed)
[facility] CONSULT NOTE  Patient Care Team: Marya Fossa, Cordelia Poche as PCP - General (Physician Assistant) Pasty Spillers, MD as Consulting Physician (Gastroenterology) Earna Coder, MD as Consulting Physician (Hematology and Oncology)  CHIEF COMPLAINTS/PURPOSE OF CONSULTATION: ANEMIA   HEMATOLOGY HISTORY:  # ANEMIA Susan Jacobs sat 4%; feretin-20; Hb 9]EGD-; colonoscopy-Pending [GFR-50-60 ]  HISTORY OF PRESENTING ILLNESS: Ambulating independently.  Accompanied by family. Susan Jacobs 70 y.o.  female has been referred to [place] for further evaluation/work-up for anemia.  Recently evaluated by GI re: Coffee ground emesis x1-after presenting to emergency room in late November. Awaiting EGD/Colo on Jan 3rd.  Currently she denies any blood in stools or black-colored stools.  She is not on iron pills.  Blood in stools: None Change in bowel habits- None Blood in urine: None Difficulty swallowing: None Abnormal weight loss: None Iron supplementation: none Prior Blood transfusions:  Bariatric surgery: None EGD/Colonoscopy:awaiting Jan 3rd,2023 [Dr.Anna]  Vaginal bleeding: None  Review of Systems  Constitutional:  Positive for malaise/fatigue. Negative for chills, diaphoresis, fever and weight loss.  HENT:  Negative for nosebleeds and sore throat.   Eyes:  Negative for double vision.  Respiratory:  Negative for cough, hemoptysis, sputum production, shortness of breath and wheezing.   Cardiovascular:  Negative for chest pain, palpitations, orthopnea and leg swelling.  Gastrointestinal:  Negative for abdominal pain, blood in stool, constipation, diarrhea, heartburn, melena, nausea and vomiting.  Genitourinary:  Negative for dysuria, frequency and urgency.  Musculoskeletal:  Positive for back pain. Negative for joint pain.  Skin: Negative.  Negative for itching and rash.  Neurological:  Negative for dizziness, tingling, focal weakness, weakness and headaches.   Endo/Heme/Allergies:  Does not bruise/bleed easily.  Psychiatric/Behavioral:  Negative for depression. The patient is not nervous/anxious and does not have insomnia.    MEDICAL HISTORY:  Past Medical History:  Diagnosis Date   Anemia    Asthma    COPD (chronic obstructive pulmonary disease) (HCC)    GI bleed    Hypercholesteremia    Hypertension     SURGICAL HISTORY: Past Surgical History:  Procedure Laterality Date   HERNIA REPAIR     TUBAL LIGATION      SOCIAL HISTORY: Social History   Socioeconomic History   Marital status: Single    Spouse name: Not on file   Number of children: Not on file   Years of education: Not on file   Highest education level: Not on file  Occupational History   Not on file  Tobacco Use   Smoking status: Former    Types: Cigarettes    Quit date: 03/05/2018    Years since quitting: 2.9   Smokeless tobacco: Never  Vaping Use   Vaping Use: Never used  Substance and Sexual Activity   Alcohol use: Yes    Alcohol/week: 3.0 standard drinks    Types: 3 Cans of beer per week   Drug use: Yes    Types: Marijuana    Comment: crack last used last weekend    Sexual activity: Not Currently  Other Topics Concern   Not on file  Social History Narrative   Not on file   Social Determinants of Health   Financial Resource Strain: Not on file  Food Insecurity: Not on file  Transportation Needs: Not on file  Physical Activity: Not on file  Stress: Not on file  Social Connections: Not on file  Intimate Partner Violence: Not on file    FAMILY HISTORY: Family History  Problem Relation Age of Onset   Pancreatic cancer Mother    Throat cancer Father    Colon cancer Father    Esophageal cancer Sister    Breast cancer Neg Hx     ALLERGIES:  is allergic to lisinopril, nsaids, and aspirin.  MEDICATIONS:  Current Outpatient Medications  Medication Sig Dispense Refill   albuterol (PROVENTIL HFA) 108 (90 Base) MCG/ACT inhaler Inhale 2 puffs into  the lungs every 4 (four) hours as needed for wheezing or shortness of breath. 1 Inhaler 0   albuterol (PROVENTIL) (2.5 MG/3ML) 0.083% nebulizer solution Take 3 mLs (2.5 mg total) by nebulization every 4 (four) hours as needed for wheezing or shortness of breath. 75 mL 0   aspirin EC 81 MG tablet Take 81 mg by mouth daily.     Fluticasone-Salmeterol (ADVAIR) 250-50 MCG/DOSE AEPB      hydrochlorothiazide (MICROZIDE) 12.5 MG capsule Take 12.5 mg by mouth daily.     losartan (COZAAR) 50 MG tablet Take 50 mg by mouth daily.     montelukast (SINGULAIR) 10 MG tablet Take 10 mg by mouth daily.     omeprazole (PRILOSEC) 20 MG capsule Take 20 mg by mouth daily.     simvastatin (ZOCOR) 40 MG tablet Take 40 mg by mouth at bedtime.     acetaminophen (TYLENOL) 500 MG tablet Take 1,000 mg by mouth every 6 (six) hours as needed for mild pain, fever or headache. (Patient not taking: Reported on 02/26/2021)     Baclofen 5 MG TABS Take 5 mg by mouth 3 (three) times daily as needed. (Patient not taking: Reported on 02/26/2021) 15 tablet 0   beclomethasone (QVAR) 40 MCG/ACT inhaler Inhale 2 puffs into the lungs 2 (two) times daily. (Patient not taking: Reported on 02/26/2021)     Coenzyme Q10 (COQ10) 100 MG CAPS Take 100 mg by mouth daily. (Patient not taking: Reported on 02/26/2021)     lidocaine (LIDODERM) 5 % Place 1 patch onto the skin daily. Remove & Discard patch within 12 hours or as directed by MD (Patient not taking: Reported on 02/26/2021) 30 patch 0   Nebulizers (COMPRESSOR/NEBULIZER) MISC 1 Units by Does not apply route every 4 (four) hours as needed. (Patient not taking: Reported on 02/26/2021) 1 each 0   nicotine (NICODERM CQ - DOSED IN MG/24 HOURS) 14 mg/24hr patch Place 1 patch (14 mg total) onto the skin daily. (Patient not taking: Reported on 02/26/2021) 28 patch 0   omeprazole (PRILOSEC OTC) 20 MG tablet Take 2 tablets (40 mg total) by mouth daily. 60 tablet 0   ondansetron (ZOFRAN ODT) 4 MG  disintegrating tablet Take 1 tablet (4 mg total) by mouth every 8 (eight) hours as needed for nausea or vomiting. (Patient not taking: Reported on 02/26/2021) 20 tablet 0   Sodium Sulfate-Mag Sulfate-KCl (SUTAB) [phone] MG TABS Per instructions given (Patient not taking: Reported on 02/26/2021) 24 tablet 0   tiotropium (SPIRIVA) 18 MCG inhalation capsule Place 1 capsule (18 mcg total) into inhaler and inhale daily. (Patient not taking: Reported on 02/26/2021) 30 capsule 0   No current facility-administered medications for this visit.      PHYSICAL EXAMINATION:   Vitals:   02/26/21 1417  BP: (!) 147/65  Pulse: 92  Temp: 98.4 F (36.9 C)  SpO2: 100%   Filed Weights   02/26/21 1417  Weight: 180 lb 1.6 oz (81.7 kg)    Physical Exam Vitals and nursing note reviewed.  HENT:     Head:  Normocephalic and atraumatic.     Mouth/Throat:     Pharynx: Oropharynx is clear.  Eyes:     Extraocular Movements: Extraocular movements intact.     Pupils: Pupils are equal, round, and reactive to light.  Cardiovascular:     Rate and Rhythm: Normal rate and regular rhythm.  Pulmonary:     Comments: Decreased breath sounds bilaterally.  Abdominal:     Palpations: Abdomen is soft.  Musculoskeletal:        General: Normal range of motion.     Cervical back: Normal range of motion.  Skin:    General: Skin is warm.  Neurological:     General: No focal deficit present.     Mental Status: She is alert and oriented to person, place, and time.  Psychiatric:        Behavior: Behavior normal.        Judgment: Judgment normal.    LABORATORY DATA:  I have reviewed the data as listed Lab Results  Component Value Date   WBC 8.8 02/26/2021   HGB 10.8 (L) 02/26/2021   HCT 35.0 (L) 02/26/2021   MCV 81.0 02/26/2021   PLT 442 (H) 02/26/2021   Recent Labs    07/20/20 1420 11/08/20 1005  NA  --  140  K  --  4.4  CL  --  109  CO2  --  23  GLUCOSE  --  111*  BUN  --  28*  CREATININE  1.30* 0.96  CALCIUM  --  8.6*  GFRNONAA  --  >60  PROT  --  6.9  ALBUMIN  --  3.0*  AST  --  37  ALT  --  34  ALKPHOS  --  88  BILITOT  --  0.4     No results found.  Symptomatic anemia #Anemia hemoglobin around 9; saturation 4%; however ferritin 21- ?  Question iron deficiency results.  Recommend starting oral iron over-the-counter after her endoscopies on Jan 3rd.  Patient is fearful of needles/wants to continue oral iron at this time.   #I had Long discussion with the patient and family regarding multiple etiologies of anemia including iron deficiency/nutritional deficiency /chronic kidney disease chronic inflammation.  Await EGD/colonoscopy as planned by GI  Jan 3rd.   # Check CBC CMP LDH CRP haptoglobin; iron studies/ferritin; myeloma panel kappa lambda light chain; review of peripheral smear.   Thank you, Tahiliani for allowing me to participate in the care of your pleasant patient. Please do not hesitate to contact me with questions or concerns in the interim.  # DISPOSITION: # labs today-ordered # follow up in 6 weeks-:MD; labs- cbc;- Dr.B     All questions were answered. The patient knows to call the clinic with any problems, questions or concerns.      Earna Coder, MD 02/26/2021 3:30 PM

## 2021-02-26 NOTE — Patient Instructions (Signed)
#  Iron sulfate 65 mg 1 pill a day-over-the-counter medication.; start after the endoscopy on Jan 3rd.

## 2021-02-27 LAB — HAPTOGLOBIN: Haptoglobin: 136 mg/dL (ref 37–355)

## 2021-03-02 ENCOUNTER — Other Ambulatory Visit: Payer: Self-pay

## 2021-03-02 ENCOUNTER — Encounter
Admission: RE | Disposition: A | Discharge status: Home / Self Care | Payer: Self-pay | Source: Ambulatory Visit | Attending: Gastroenterology

## 2021-03-02 ENCOUNTER — Ambulatory Visit: Payer: Medicare Other | Admitting: Certified Registered Nurse Anesthetist

## 2021-03-02 ENCOUNTER — Encounter: Payer: Self-pay | Admitting: Gastroenterology

## 2021-03-02 ENCOUNTER — Ambulatory Visit
Admission: RE | Admit: 2021-03-02 | Discharge: 2021-03-02 | Disposition: A | Discharge status: Home / Self Care | Payer: Medicare Other | Source: Ambulatory Visit | Attending: Gastroenterology | Admitting: Gastroenterology

## 2021-03-02 DIAGNOSIS — Z539 Procedure and treatment not carried out, unspecified reason: Secondary | ICD-10-CM | POA: Diagnosis not present

## 2021-03-02 DIAGNOSIS — I10 Essential (primary) hypertension: Secondary | ICD-10-CM | POA: Insufficient documentation

## 2021-03-02 DIAGNOSIS — E7800 Pure hypercholesterolemia, unspecified: Secondary | ICD-10-CM | POA: Diagnosis not present

## 2021-03-02 DIAGNOSIS — Z87891 Personal history of nicotine dependence: Secondary | ICD-10-CM | POA: Insufficient documentation

## 2021-03-02 DIAGNOSIS — D509 Iron deficiency anemia, unspecified: Secondary | ICD-10-CM | POA: Diagnosis present

## 2021-03-02 DIAGNOSIS — J449 Chronic obstructive pulmonary disease, unspecified: Secondary | ICD-10-CM | POA: Insufficient documentation

## 2021-03-02 DIAGNOSIS — D649 Anemia, unspecified: Secondary | ICD-10-CM

## 2021-03-02 HISTORY — DX: Cardiac murmur, unspecified: R01.1

## 2021-03-02 LAB — URINE DRUG SCREEN, QUALITATIVE (ARMC ONLY)
Amphetamines, Ur Screen: NOT DETECTED
Barbiturates, Ur Screen: NOT DETECTED
Benzodiazepine, Ur Scrn: NOT DETECTED
Cannabinoid 50 Ng, Ur ~~LOC~~: NOT DETECTED
Cocaine Metabolite,Ur ~~LOC~~: POSITIVE — AB
MDMA (Ecstasy)Ur Screen: NOT DETECTED
Methadone Scn, Ur: NOT DETECTED
Opiate, Ur Screen: NOT DETECTED
Phencyclidine (PCP) Ur S: NOT DETECTED
Tricyclic, Ur Screen: NOT DETECTED

## 2021-03-02 LAB — KAPPA/LAMBDA LIGHT CHAINS
Kappa free light chain: 22.5 mg/L — ABNORMAL HIGH (ref 3.3–19.4)
Kappa, lambda light chain ratio: 0.73 (ref 0.26–1.65)
Lambda free light chains: 30.7 mg/L — ABNORMAL HIGH (ref 5.7–26.3)

## 2021-03-02 SURGERY — ESOPHAGOGASTRODUODENOSCOPY (EGD) WITH PROPOFOL
Anesthesia: General

## 2021-03-02 MED ORDER — SODIUM CHLORIDE 0.9 % IV SOLN: INTRAVENOUS | Status: DC

## 2021-03-02 NOTE — Anesthesia Preprocedure Evaluation (Signed)
Anesthesia Evaluation  Patient identified by MRN, date of birth, ID band Patient awake    Reviewed: Allergy & Precautions, NPO status , Patient's Chart, lab work & pertinent test results  History of Anesthesia Complications Negative for: history of anesthetic complications  Airway Mallampati: III  TM Distance: >3 FB Neck ROM: full    Dental  (+) Chipped, Poor Dentition, Missing   Pulmonary asthma , COPD, former smoker,    Pulmonary exam normal        Cardiovascular Exercise Tolerance: Good hypertension, (-) Past MI Normal cardiovascular exam     Neuro/Psych negative neurological ROS  negative psych ROS   GI/Hepatic negative GI ROS, Neg liver ROS, neg GERD  ,  Endo/Other  negative endocrine ROS  Renal/GU negative Renal ROS  negative genitourinary   Musculoskeletal   Abdominal   Peds  Hematology negative hematology ROS (+)   Anesthesia Other Findings Past Medical History: No date: Anemia No date: Asthma No date: COPD (chronic obstructive pulmonary disease) (HCC) No date: GI bleed No date: Heart murmur No date: Hypercholesteremia No date: Hypertension  Past Surgical History: No date: HERNIA REPAIR No date: TUBAL LIGATION  BMI    Body Mass Index: 30.90 kg/m      Reproductive/Obstetrics negative OB ROS                             Anesthesia Physical Anesthesia Plan  ASA: 3  Anesthesia Plan: General   Post-op Pain Management:    Induction: Intravenous  PONV Risk Score and Plan: Propofol infusion and TIVA  Airway Management Planned: Natural Airway and Nasal Cannula  Additional Equipment:   Intra-op Plan:   Post-operative Plan:   Informed Consent: I have reviewed the patients History and Physical, chart, labs and discussed the procedure including the risks, benefits and alternatives for the proposed anesthesia with the patient or authorized representative who has  indicated his/her understanding and acceptance.     Dental Advisory Given  Plan Discussed with: Anesthesiologist, CRNA and Surgeon  Anesthesia Plan Comments: (Patient consented for risks of anesthesia including but not limited to:  - adverse reactions to medications - risk of airway placement if required - damage to eyes, teeth, lips or other oral mucosa - nerve damage due to positioning  - sore throat or hoarseness - Damage to heart, brain, nerves, lungs, other parts of body or loss of life  Patient voiced understanding.)        Anesthesia Quick Evaluation

## 2021-03-02 NOTE — H&P (Signed)
Wyline Mood, MD [address], [place], [place], [address] [address], [place], [place], [address] Phone: [phone]  Fax: [phone]  Primary Care Physician:  Marya Fossa, PA-C   Pre-Procedure History & Physical: HPI:  Susan Jacobs is a 71 y.o. female is here for an endoscopy and colonoscopy    Past Medical History:  Diagnosis Date   Anemia    Asthma    COPD (chronic obstructive pulmonary disease) (HCC)    GI bleed    Heart murmur    Hypercholesteremia    Hypertension     Past Surgical History:  Procedure Laterality Date   HERNIA REPAIR     TUBAL LIGATION      Prior to Admission medications   Medication Sig Start Date End Date Taking? Authorizing Provider  albuterol (PROVENTIL HFA) 108 (90 Base) MCG/ACT inhaler Inhale 2 puffs into the lungs every 4 (four) hours as needed for wheezing or shortness of breath. 02/15/17  Yes Sharman Cheek, MD  albuterol (PROVENTIL) (2.5 MG/3ML) 0.083% nebulizer solution Take 3 mLs (2.5 mg total) by nebulization every 4 (four) hours as needed for wheezing or shortness of breath. 03/06/18  Yes Irean Hong, MD  aspirin EC 81 MG tablet Take 81 mg by mouth daily.   Yes [provider]  hydrochlorothiazide (MICROZIDE) 12.5 MG capsule Take 12.5 mg by mouth daily.   Yes [provider]  losartan (COZAAR) 50 MG tablet Take 50 mg by mouth daily. 12/25/18  Yes [provider]  omeprazole (PRILOSEC) 20 MG capsule Take 20 mg by mouth daily.   Yes [provider]  simvastatin (ZOCOR) 40 MG tablet Take 40 mg by mouth at bedtime. 12/25/18  Yes [provider]  acetaminophen (TYLENOL) 500 MG tablet Take 1,000 mg by mouth every 6 (six) hours as needed for mild pain, fever or headache. Patient not taking: Reported on 02/26/2021    [provider]  Baclofen 5 MG TABS Take 5 mg by mouth 3 (three) times daily as needed. Patient not taking: Reported on 02/26/2021 12/12/18    Enid Derry, PA-C  beclomethasone (QVAR) 40 MCG/ACT inhaler Inhale 2 puffs into the lungs 2 (two) times daily. Patient not taking: Reported on 02/26/2021    [provider]  Coenzyme Q10 (COQ10) 100 MG CAPS Take 100 mg by mouth daily. Patient not taking: Reported on 02/26/2021    [provider]  Fluticasone-Salmeterol (ADVAIR) 250-50 MCG/DOSE AEPB  02/08/19   [provider]  lidocaine (LIDODERM) 5 % Place 1 patch onto the skin daily. Remove & Discard patch within 12 hours or as directed by MD Patient not taking: Reported on 02/26/2021 12/12/18   Enid Derry, PA-C  montelukast (SINGULAIR) 10 MG tablet Take 10 mg by mouth daily. 12/24/18   [provider]  Nebulizers (COMPRESSOR/NEBULIZER) MISC 1 Units by Does not apply route every 4 (four) hours as needed. Patient not taking: Reported on 02/26/2021 03/06/18   Irean Hong, MD  nicotine (NICODERM CQ - DOSED IN MG/24 HOURS) 14 mg/24hr patch Place 1 patch (14 mg total) onto the skin daily. Patient not taking: Reported on 02/26/2021 08/22/15   Alford Highland, MD  omeprazole (PRILOSEC OTC) 20 MG tablet Take 2 tablets (40 mg total) by mouth daily. 11/08/20 12/08/20  Sharman Cheek, MD  ondansetron (ZOFRAN ODT) 4 MG disintegrating tablet Take 1 tablet (4 mg total) by mouth every 8 (eight) hours as needed for nausea or vomiting. Patient not  taking: Reported on 02/26/2021 11/08/20   Sharman Cheek, MD  Sodium Sulfate-Mag Sulfate-KCl (SUTAB) [phone] MG TABS Per instructions given Patient not taking: Reported on 02/26/2021 01/28/21   Pasty Spillers, MD  tiotropium (SPIRIVA) 18 MCG inhalation capsule Place 1 capsule (18 mcg total) into inhaler and inhale daily. Patient not taking: Reported on 02/26/2021 03/14/18   Jama Flavors, MD    Allergies as of 01/29/2021 - Review Complete 01/28/2021  Allergen Reaction Noted   Lisinopril Other (See Comments) and Hives 04/23/2014   Nsaids  04/23/2014   Aspirin  Palpitations and Other (See Comments) 08/19/2015    Family History  Problem Relation Age of Onset   Pancreatic cancer Mother    Throat cancer Father    Colon cancer Father    Esophageal cancer Sister    Breast cancer Neg Hx     Social History   Socioeconomic History   Marital status: Single    Spouse name: Not on file   Number of children: Not on file   Years of education: Not on file   Highest education level: Not on file  Occupational History   Not on file  Tobacco Use   Smoking status: Former    Types: Cigarettes    Quit date: 03/05/2018    Years since quitting: 2.9   Smokeless tobacco: Never  Vaping Use   Vaping Use: Never used  Substance and Sexual Activity   Alcohol use: Yes    Alcohol/week: 3.0 standard drinks    Types: 3 Cans of beer per week   Drug use: Yes    Types: Marijuana, "Crack" cocaine    Comment: used maybe a week ago   Sexual activity: Not Currently  Other Topics Concern   Not on file  Social History Narrative   Not on file   Social Determinants of Health   Financial Resource Strain: Not on file  Food Insecurity: Not on file  Transportation Needs: Not on file  Physical Activity: Not on file  Stress: Not on file  Social Connections: Not on file  Intimate Partner Violence: Not on file    Review of Systems: See HPI, otherwise negative ROS  Physical Exam: BP 135/71    Pulse 79    Temp (!) 96 F (35.6 C) (Temporal)    Resp 18    Ht 5\' 4"  (1.626 m)    Wt 81.6 kg    SpO2 100%    BMI 30.90 kg/m  General:   Alert,  pleasant and cooperative in NAD Head:  Normocephalic and atraumatic. Neck:  Supple; no masses or thyromegaly. Lungs:  Clear throughout to auscultation, normal respiratory effort.    Heart:  +S1, +S2, Regular rate and rhythm, No edema. Abdomen:  Soft, nontender and nondistended. Normal bowel sounds, without guarding, and without rebound.   Neurologic:  Alert and  oriented x4;  grossly normal  neurologically.  Impression/Plan: Susan Jacobs is here for an endoscopy and colonoscopy  to be performed for  evaluation of iron deficiency anemia     Risks, benefits, limitations, and alternatives regarding endoscopy have been reviewed with the patient.  Questions have been answered.  All parties agreeable.   Lonn Georgia, MD  03/02/2021, 9:55 AM

## 2021-03-09 NOTE — Progress Notes (Signed)
Patient with cocaine use history.  UDS was ordered as part of our PreOp standard of care workup for such history.

## 2021-04-09 ENCOUNTER — Inpatient Hospital Stay: Payer: Medicare Other | Admitting: Internal Medicine

## 2021-04-09 ENCOUNTER — Inpatient Hospital Stay: Payer: Medicare Other | Attending: Internal Medicine

## 2021-04-09 NOTE — Progress Notes (Deleted)
[facility] CONSULT NOTE  Patient Care Team: Marya Fossa, Cordelia Poche as PCP - General (Physician Assistant) Pasty Spillers, MD (Inactive) as Consulting Physician (Gastroenterology) Earna Coder, MD as Consulting Physician (Hematology and Oncology)  CHIEF COMPLAINTS/PURPOSE OF CONSULTATION: ANEMIA   HEMATOLOGY HISTORY:  # ANEMIA [name] sat 4%; feretin-20; Hb 9]EGD-; colonoscopy-Pending [GFR-50-60 ]  HISTORY OF PRESENTING ILLNESS: Ambulating independently.  Accompanied by family. Susan Jacobs 71 y.o.  female has been referred to [place] for further evaluation/work-up for anemia.  Recently evaluated by GI re: Coffee ground emesis x1-after presenting to emergency room in late November. Awaiting EGD/Colo on Jan 3rd.  Currently she denies any blood in stools or black-colored stools.  She is not on iron pills.  Blood in stools: None Change in bowel habits- None Blood in urine: None Difficulty swallowing: None Abnormal weight loss: None Iron supplementation: none Prior Blood transfusions:  Bariatric surgery: None EGD/Colonoscopy:awaiting Jan 3rd,2023 [Dr.Anna]  Vaginal bleeding: None  Review of Systems  Constitutional:  Positive for malaise/fatigue. Negative for chills, diaphoresis, fever and weight loss.  HENT:  Negative for nosebleeds and sore throat.   Eyes:  Negative for double vision.  Respiratory:  Negative for cough, hemoptysis, sputum production, shortness of breath and wheezing.   Cardiovascular:  Negative for chest pain, palpitations, orthopnea and leg swelling.  Gastrointestinal:  Negative for abdominal pain, blood in stool, constipation, diarrhea, heartburn, melena, nausea and vomiting.  Genitourinary:  Negative for dysuria, frequency and urgency.  Musculoskeletal:  Positive for back pain. Negative for joint pain.  Skin: Negative.  Negative for itching and rash.  Neurological:  Negative for dizziness, tingling, focal weakness, weakness and headaches.   Endo/Heme/Allergies:  Does not bruise/bleed easily.  Psychiatric/Behavioral:  Negative for depression. The patient is not nervous/anxious and does not have insomnia.    MEDICAL HISTORY:  Past Medical History:  Diagnosis Date   Anemia    Asthma    COPD (chronic obstructive pulmonary disease) (HCC)    GI bleed    Heart murmur    Hypercholesteremia    Hypertension     SURGICAL HISTORY: Past Surgical History:  Procedure Laterality Date   HERNIA REPAIR     TUBAL LIGATION      SOCIAL HISTORY: Social History   Socioeconomic History   Marital status: Single    Spouse name: Not on file   Number of children: Not on file   Years of education: Not on file   Highest education level: Not on file  Occupational History   Not on file  Tobacco Use   Smoking status: Former    Types: Cigarettes    Quit date: 03/05/2018    Years since quitting: 3.0   Smokeless tobacco: Never  Vaping Use   Vaping Use: Never used  Substance and Sexual Activity   Alcohol use: Yes    Alcohol/week: 3.0 standard drinks    Types: 3 Cans of beer per week   Drug use: Yes    Types: Marijuana, "Crack" cocaine    Comment: used maybe a week ago   Sexual activity: Not Currently  Other Topics Concern   Not on file  Social History Narrative   Not on file   Social Determinants of Health   Financial Resource Strain: Not on file  Food Insecurity: Not on file  Transportation Needs: Not on file  Physical Activity: Not on file  Stress: Not on file  Social Connections: Not on file  Intimate Partner Violence: Not on file  FAMILY HISTORY: Family History  Problem Relation Age of Onset   Pancreatic cancer Mother    Throat cancer Father    Colon cancer Father    Esophageal cancer Sister    Breast cancer Neg Hx     ALLERGIES:  is allergic to lisinopril, nsaids, and aspirin.  MEDICATIONS:  Current Outpatient Medications  Medication Sig Dispense Refill   acetaminophen  (TYLENOL) 500 MG tablet Take 1,000 mg by mouth every 6 (six) hours as needed for mild pain, fever or headache. (Patient not taking: Reported on 02/26/2021)     albuterol (PROVENTIL HFA) 108 (90 Base) MCG/ACT inhaler Inhale 2 puffs into the lungs every 4 (four) hours as needed for wheezing or shortness of breath. 1 Inhaler 0   albuterol (PROVENTIL) (2.5 MG/3ML) 0.083% nebulizer solution Take 3 mLs (2.5 mg total) by nebulization every 4 (four) hours as needed for wheezing or shortness of breath. 75 mL 0   aspirin EC 81 MG tablet Take 81 mg by mouth daily.     Baclofen 5 MG TABS Take 5 mg by mouth 3 (three) times daily as needed. (Patient not taking: Reported on 02/26/2021) 15 tablet 0   beclomethasone (QVAR) 40 MCG/ACT inhaler Inhale 2 puffs into the lungs 2 (two) times daily. (Patient not taking: Reported on 02/26/2021)     Coenzyme Q10 (COQ10) 100 MG CAPS Take 100 mg by mouth daily. (Patient not taking: Reported on 02/26/2021)     Fluticasone-Salmeterol (ADVAIR) 250-50 MCG/DOSE AEPB      hydrochlorothiazide (MICROZIDE) 12.5 MG capsule Take 12.5 mg by mouth daily.     lidocaine (LIDODERM) 5 % Place 1 patch onto the skin daily. Remove & Discard patch within 12 hours or as directed by MD (Patient not taking: Reported on 02/26/2021) 30 patch 0   losartan (COZAAR) 50 MG tablet Take 50 mg by mouth daily.     montelukast (SINGULAIR) 10 MG tablet Take 10 mg by mouth daily.     Nebulizers (COMPRESSOR/NEBULIZER) MISC 1 Units by Does not apply route every 4 (four) hours as needed. (Patient not taking: Reported on 02/26/2021) 1 each 0   nicotine (NICODERM CQ - DOSED IN MG/24 HOURS) 14 mg/24hr patch Place 1 patch (14 mg total) onto the skin daily. (Patient not taking: Reported on 02/26/2021) 28 patch 0   omeprazole (PRILOSEC OTC) 20 MG tablet Take 2 tablets (40 mg total) by mouth daily. 60 tablet 0   omeprazole (PRILOSEC) 20 MG capsule Take 20 mg by mouth daily.     ondansetron (ZOFRAN ODT) 4 MG  disintegrating tablet Take 1 tablet (4 mg total) by mouth every 8 (eight) hours as needed for nausea or vomiting. (Patient not taking: Reported on 02/26/2021) 20 tablet 0   simvastatin (ZOCOR) 40 MG tablet Take 40 mg by mouth at bedtime.     Sodium Sulfate-Mag Sulfate-KCl (SUTAB) [phone] MG TABS Per instructions given (Patient not taking: Reported on 02/26/2021) 24 tablet 0   tiotropium (SPIRIVA) 18 MCG inhalation capsule Place 1 capsule (18 mcg total) into inhaler and inhale daily. (Patient not taking: Reported on 02/26/2021) 30 capsule 0   No current facility-administered medications for this visit.      PHYSICAL EXAMINATION:   There were no vitals filed for this visit.  There were no vitals filed for this visit.   Physical Exam Vitals and nursing note reviewed.  HENT:     Head: Normocephalic and atraumatic.     Mouth/Throat:     Pharynx: Oropharynx is clear.  Eyes:     Extraocular Movements: Extraocular movements intact.     Pupils: Pupils are equal, round, and reactive to light.  Cardiovascular:     Rate and Rhythm: Normal rate and regular rhythm.  Pulmonary:     Comments: Decreased breath sounds bilaterally.  Abdominal:     Palpations: Abdomen is soft.  Musculoskeletal:        General: Normal range of motion.     Cervical back: Normal range of motion.  Skin:    General: Skin is warm.  Neurological:     General: No focal deficit present.     Mental Status: She is alert and oriented to person, place, and time.  Psychiatric:        Behavior: Behavior normal.        Judgment: Judgment normal.    LABORATORY DATA:  I have reviewed the data as listed Lab Results  Component Value Date   WBC 8.8 02/26/2021   HGB 10.8 (L) 02/26/2021   HCT 35.0 (L) 02/26/2021   MCV 81.0 02/26/2021   PLT 442 (H) 02/26/2021   Recent Labs    07/20/20 1420 11/08/20 1005 02/26/21 1454  NA  --  140 135  K  --  4.4 3.4*  CL  --  109 100  CO2  --  23 24  GLUCOSE  --  111*  100*  BUN  --  28* 13  CREATININE 1.30* 0.96 1.23*  CALCIUM  --  8.6* 9.1  GFRNONAA  --  >60 48*  PROT  --  6.9 7.8  ALBUMIN  --  3.0* 3.6  AST  --  37 30  ALT  --  34 25  ALKPHOS  --  88 96  BILITOT  --  0.4 <0.1*      No results found.  No problem-specific Assessment & Plan notes found for this encounter.     All questions were answered. The patient knows to call the clinic with any problems, questions or concerns.      Earna Coder, MD 04/09/2021 10:20 AM

## 2021-04-09 NOTE — Assessment & Plan Note (Deleted)
#  Anemia hemoglobin around 9; saturation 4%; however ferritin 21- ?  Question iron deficiency results.  Recommend starting oral iron over-the-counter after her endoscopies on Jan 3rd.  Patient is fearful of needles/wants to continue oral iron at this time.   #I had Long discussion with the patient and family regarding multiple etiologies of anemia including iron deficiency/nutritional deficiency /chronic kidney disease chronic inflammation.  Await EGD/colonoscopy as planned by GI  Jan 3rd.   # Check CBC CMP LDH CRP haptoglobin; iron studies/ferritin; myeloma panel kappa lambda light chain; review of peripheral smear.   Thank you, Tahiliani for allowing me to participate in the care of your pleasant patient. Please do not hesitate to contact me with questions or concerns in the interim.  # DISPOSITION: # labs today-ordered # follow up in 6 weeks-:MD; labs- cbc;- Dr.B

## 2021-08-09 ENCOUNTER — Other Ambulatory Visit: Payer: Self-pay | Admitting: Physician Assistant

## 2021-08-25 ENCOUNTER — Ambulatory Visit
Admission: RE | Admit: 2021-08-25 | Discharge: 2021-08-25 | Disposition: A | Discharge status: Home / Self Care | Payer: Medicare Other | Source: Ambulatory Visit | Attending: Physician Assistant | Admitting: Physician Assistant

## 2021-08-25 DIAGNOSIS — R928 Other abnormal and inconclusive findings on diagnostic imaging of breast: Secondary | ICD-10-CM | POA: Insufficient documentation

## 2021-08-25 DIAGNOSIS — N6320 Unspecified lump in the left breast, unspecified quadrant: Secondary | ICD-10-CM | POA: Insufficient documentation

## 2021-08-25 DIAGNOSIS — N6310 Unspecified lump in the right breast, unspecified quadrant: Secondary | ICD-10-CM | POA: Insufficient documentation

## 2022-07-29 IMAGING — MG MM DIGITAL SCREENING BILAT W/ TOMO AND CAD
8 series · 8 of 24 positions shown · non-contrast
Comparison: Previous exam(s).

CLINICAL DATA: Screening.

EXAM:
DIGITAL SCREENING BILATERAL MAMMOGRAM WITH TOMOSYNTHESIS AND CAD
TECHNIQUE: Bilateral screening digital craniocaudal and mediolateral oblique
mammograms were obtained. Bilateral screening digital breast
tomosynthesis was performed. The images were evaluated with
computer-aided detection.

[R MLO synth-2D]
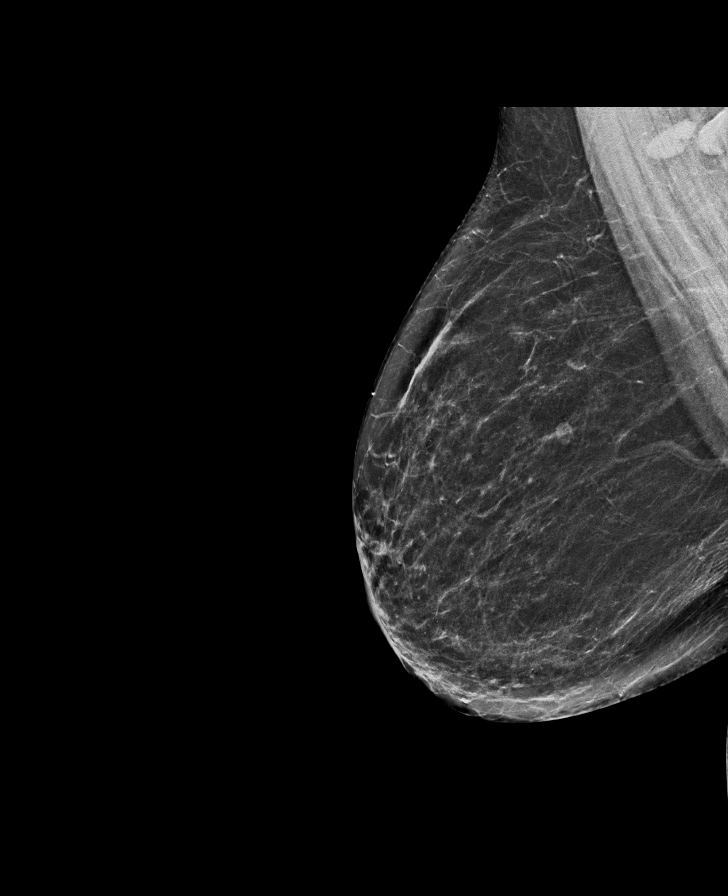

[L MLO synth-2D]
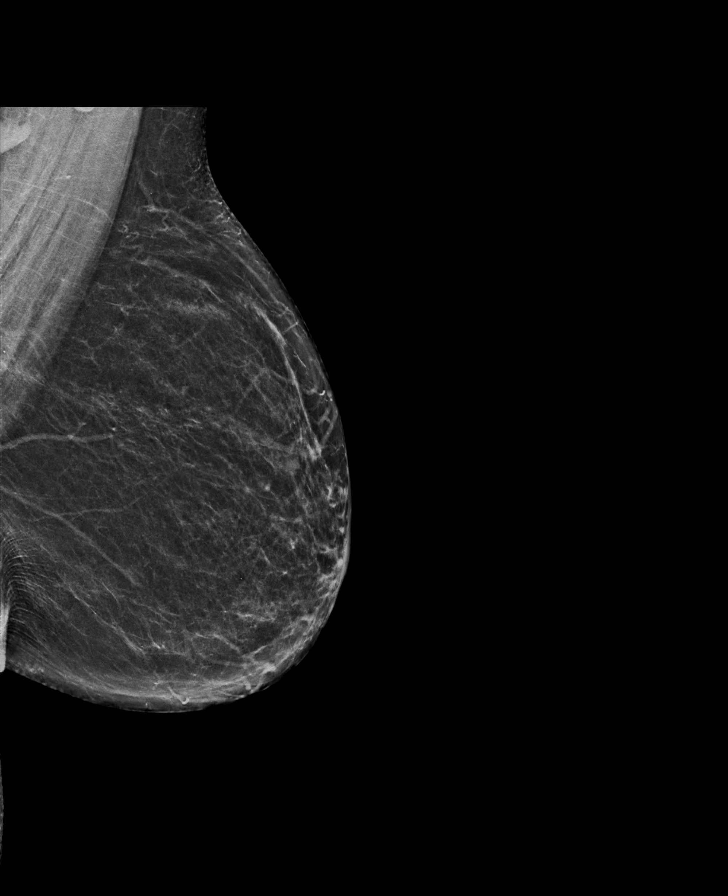

[L CC synth-2D]
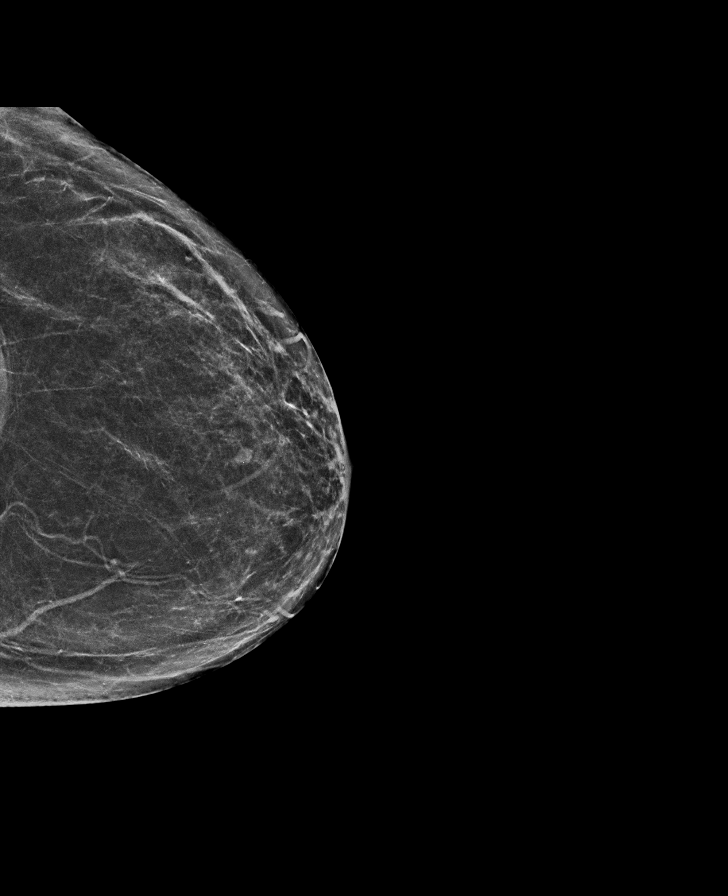

[R CC synth-2D]
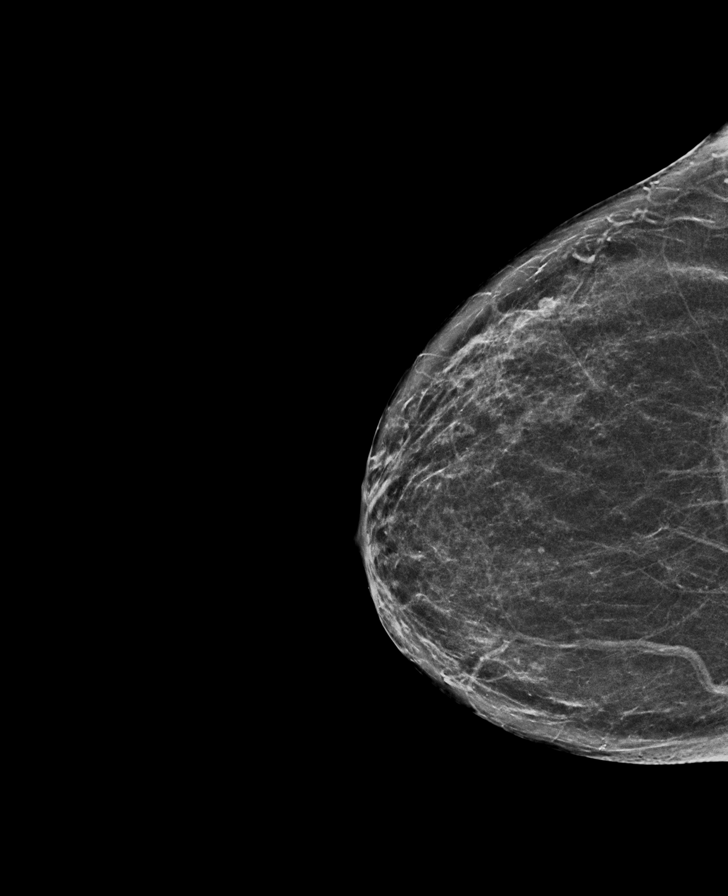

[L CC tomo · tomo slice 31/62.0]
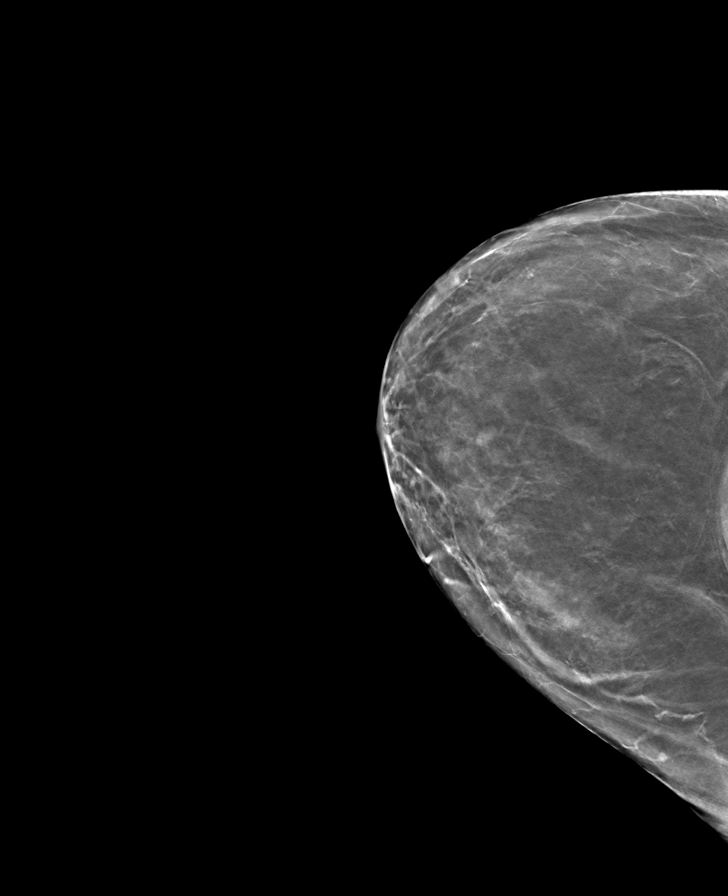

[R MLO tomo · tomo slice 36/71.0]
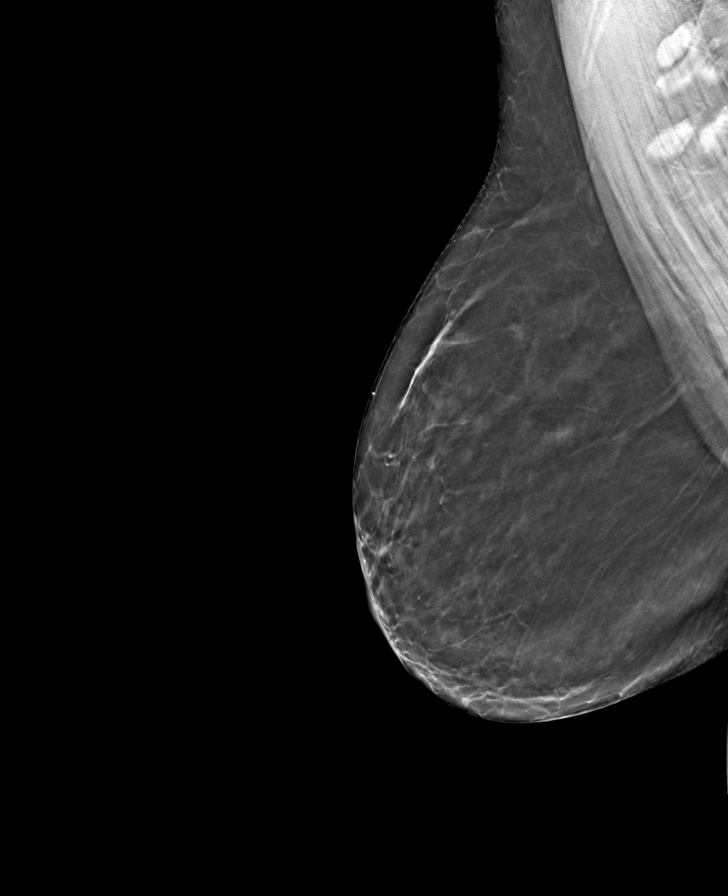

[R CC tomo · tomo slice 32/63.0]
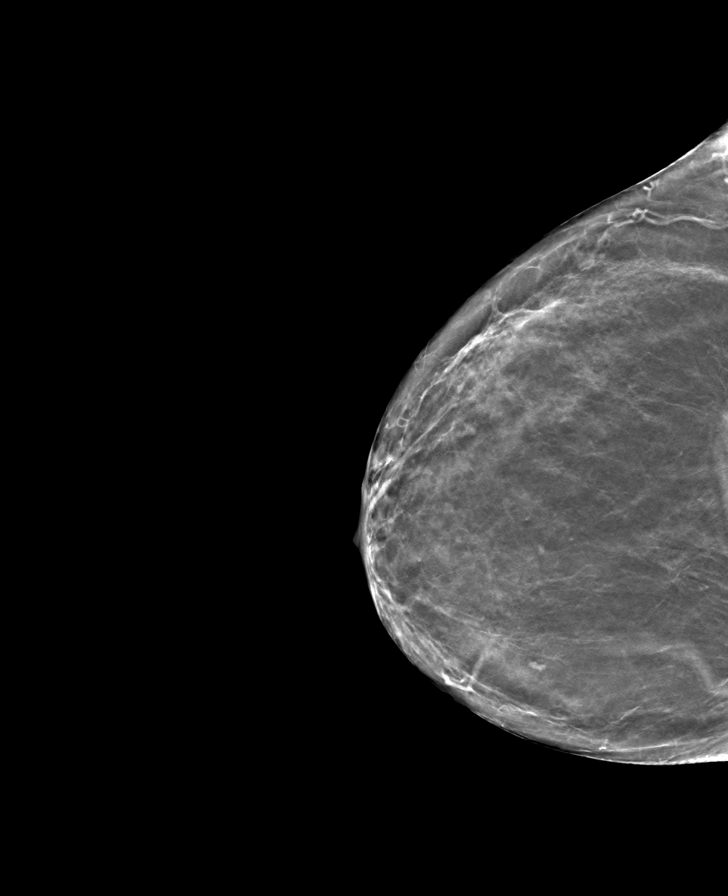

[L MLO tomo · tomo slice 33/66.0]
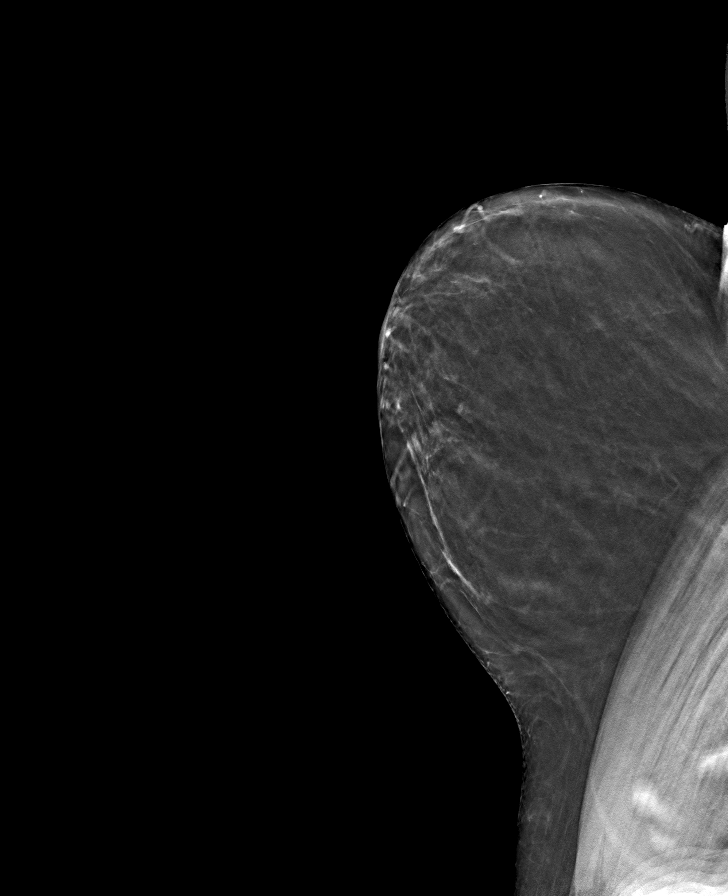

[8 of 24 positions shown; findings below may reference images not displayed]

ACR Breast Density Category b: There are scattered areas of
fibroglandular density.
FINDINGS: In the right breast , a possible mass requires further evaluation.
This possible mass is seen within the outer RIGHT breast, cc slice
58.

In the left breast, a possible mass warrants further evaluation.
This possible mass is seen within the upper LEFT breast, cc slice
21.
IMPRESSION: Further evaluation is suggested for possible mass in the right
breast.

Further evaluation is suggested for possible mass in the left
breast.

RECOMMENDATION:
1. Diagnostic mammogram and possibly ultrasound of the RIGHT breast.
(Code:[id number]-I-77T)
2. LEFT breast ultrasound only.

The patient will be contacted regarding the findings, and additional
imaging will be scheduled.

BI-RADS CATEGORY  0: Incomplete. Need additional imaging evaluation
and/or prior mammograms for comparison.

## 2022-09-06 ENCOUNTER — Other Ambulatory Visit: Payer: Self-pay | Admitting: Physician Assistant

## 2022-09-06 DIAGNOSIS — Z1231 Encounter for screening mammogram for malignant neoplasm of breast: Secondary | ICD-10-CM

## 2022-09-20 ENCOUNTER — Encounter: Payer: Self-pay | Admitting: Internal Medicine

## 2022-10-03 ENCOUNTER — Ambulatory Visit (INDEPENDENT_AMBULATORY_CARE_PROVIDER_SITE_OTHER): Payer: 59 | Admitting: Podiatry

## 2022-10-03 DIAGNOSIS — Z91199 Patient's noncompliance with other medical treatment and regimen due to unspecified reason: Secondary | ICD-10-CM

## 2022-10-04 NOTE — Progress Notes (Signed)
Patient was no-show for appointment today 

## 2022-11-09 ENCOUNTER — Encounter: Payer: Self-pay | Admitting: Internal Medicine

## 2022-11-10 ENCOUNTER — Encounter: Payer: Self-pay | Admitting: Internal Medicine

## 2022-11-16 ENCOUNTER — Ambulatory Visit
Admission: RE | Admit: 2022-11-16 | Discharge: 2022-11-16 | Disposition: A | Discharge status: Home / Self Care | Payer: 59 | Source: Ambulatory Visit | Attending: Physician Assistant | Admitting: Physician Assistant

## 2022-11-16 ENCOUNTER — Ambulatory Visit: Payer: 59 | Admitting: Podiatry

## 2022-11-16 DIAGNOSIS — Z1231 Encounter for screening mammogram for malignant neoplasm of breast: Secondary | ICD-10-CM | POA: Diagnosis present

## 2022-11-29 ENCOUNTER — Other Ambulatory Visit: Payer: Self-pay

## 2022-11-29 ENCOUNTER — Emergency Department: Payer: 59

## 2022-11-29 ENCOUNTER — Emergency Department
Admission: EM | Admit: 2022-11-29 | Discharge: 2022-11-29 | Disposition: A | Discharge status: Home / Self Care | Payer: 59 | Attending: Emergency Medicine | Admitting: Emergency Medicine

## 2022-11-29 DIAGNOSIS — S0990XA Unspecified injury of head, initial encounter: Secondary | ICD-10-CM | POA: Insufficient documentation

## 2022-11-29 DIAGNOSIS — Z7982 Long term (current) use of aspirin: Secondary | ICD-10-CM | POA: Diagnosis not present

## 2022-11-29 DIAGNOSIS — S3991XA Unspecified injury of abdomen, initial encounter: Secondary | ICD-10-CM | POA: Diagnosis present

## 2022-11-29 DIAGNOSIS — K449 Diaphragmatic hernia without obstruction or gangrene: Secondary | ICD-10-CM | POA: Insufficient documentation

## 2022-11-29 DIAGNOSIS — I700 Atherosclerosis of aorta: Secondary | ICD-10-CM | POA: Diagnosis not present

## 2022-11-29 DIAGNOSIS — M542 Cervicalgia: Secondary | ICD-10-CM | POA: Insufficient documentation

## 2022-11-29 DIAGNOSIS — Y92410 Unspecified street and highway as the place of occurrence of the external cause: Secondary | ICD-10-CM | POA: Diagnosis not present

## 2022-11-29 DIAGNOSIS — M25551 Pain in right hip: Secondary | ICD-10-CM | POA: Diagnosis not present

## 2022-11-29 DIAGNOSIS — S301XXA Contusion of abdominal wall, initial encounter: Secondary | ICD-10-CM | POA: Insufficient documentation

## 2022-11-29 LAB — COMPREHENSIVE METABOLIC PANEL
ALT: 15 U/L (ref 0–44)
AST: 19 U/L (ref 15–41)
Albumin: 3.8 g/dL (ref 3.5–5.0)
Alkaline Phosphatase: 117 U/L (ref 38–126)
Anion gap: 15 (ref 5–15)
BUN: 18 mg/dL (ref 8–23)
CO2: 25 mmol/L (ref 22–32)
Calcium: 9.3 mg/dL (ref 8.9–10.3)
Chloride: 99 mmol/L (ref 98–111)
Creatinine, Ser: 1.69 mg/dL — ABNORMAL HIGH (ref 0.44–1.00)
GFR, Estimated: 32 mL/min — ABNORMAL LOW (ref >60)
Glucose, Bld: 96 mg/dL (ref 70–99)
Potassium: 3.7 mmol/L (ref 3.5–5.1)
Sodium: 139 mmol/L (ref 135–145)
Total Bilirubin: 0.6 mg/dL (ref 0.3–1.2)
Total Protein: 8.7 g/dL — ABNORMAL HIGH (ref 6.5–8.1)

## 2022-11-29 LAB — CBC WITH DIFFERENTIAL/PLATELET
Abs Immature Granulocytes: 0.03 10*3/uL (ref 0.00–0.07)
Basophils Absolute: 0 10*3/uL (ref 0.0–0.1)
Basophils Relative: 0 %
Eosinophils Absolute: 0.2 10*3/uL (ref 0.0–0.5)
Eosinophils Relative: 2 %
HCT: 41.5 % (ref 36.0–46.0)
Hemoglobin: 13.2 g/dL (ref 12.0–15.0)
Immature Granulocytes: 0 %
Lymphocytes Relative: 20 %
Lymphs Abs: 2 10*3/uL (ref 0.7–4.0)
MCH: 25.3 pg — ABNORMAL LOW (ref 26.0–34.0)
MCHC: 31.8 g/dL (ref 30.0–36.0)
MCV: 79.7 fL — ABNORMAL LOW (ref 80.0–100.0)
Monocytes Absolute: 0.5 10*3/uL (ref 0.1–1.0)
Monocytes Relative: 5 %
Neutro Abs: 7 10*3/uL (ref 1.7–7.7)
Neutrophils Relative %: 73 %
Platelets: 376 10*3/uL (ref 150–400)
RBC: 5.21 MIL/uL — ABNORMAL HIGH (ref 3.87–5.11)
RDW: 15.5 % (ref 11.5–15.5)
WBC: 9.7 10*3/uL (ref 4.0–10.5)
nRBC: 0 % (ref 0.0–0.2)

## 2022-11-29 LAB — LIPASE, BLOOD: Lipase: 43 U/L (ref 11–51)

## 2022-11-29 MED ORDER — IOHEXOL 300 MG/ML  SOLN
100.0000 mL | Freq: Once | INTRAMUSCULAR | Status: AC | PRN
  Administered 2022-11-29: 100 mL via INTRAVENOUS

## 2022-11-29 MED ORDER — ONDANSETRON HCL 4 MG/2ML IJ SOLN
4.0000 mg | Freq: Once | INTRAMUSCULAR | Status: AC
  Administered 2022-11-29: 4 mg via INTRAVENOUS
  Filled 2022-11-29: qty 2

## 2022-11-29 MED ORDER — ONDANSETRON 4 MG PO TBDP: 4.0000 mg | ORAL_TABLET | Freq: Three times a day (TID) | ORAL | 0 refills | Status: DC | PRN

## 2022-11-29 MED ORDER — HYDROCODONE-ACETAMINOPHEN 5-325 MG PO TABS: 1.0000 | ORAL_TABLET | Freq: Four times a day (QID) | ORAL | 0 refills | Status: DC | PRN

## 2022-11-29 MED ORDER — SODIUM CHLORIDE 0.9 % IV BOLUS
1000.0000 mL | Freq: Once | INTRAVENOUS | Status: AC
  Administered 2022-11-29: 1000 mL via INTRAVENOUS

## 2022-11-29 MED ORDER — MORPHINE SULFATE (PF) 4 MG/ML IV SOLN
4.0000 mg | Freq: Once | INTRAVENOUS | Status: AC
  Administered 2022-11-29: 4 mg via INTRAVENOUS
  Filled 2022-11-29: qty 1
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
## 2022-11-29 NOTE — ED Notes (Signed)
Pt transported to CT ?
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
## 2022-11-29 NOTE — ED Notes (Signed)
Pt d/c home per MD order. Discharge summary reviewed, pt verbalizes understanding. Ambulatory off unit , pt visitor is discharge ride home.
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
## 2022-11-29 NOTE — ED Notes (Signed)
This RN unsuccessful at IV insertion.

## 2022-12-14 ENCOUNTER — Ambulatory Visit (INDEPENDENT_AMBULATORY_CARE_PROVIDER_SITE_OTHER): Payer: 59 | Admitting: Podiatry

## 2022-12-14 ENCOUNTER — Encounter: Payer: Self-pay | Admitting: Podiatry

## 2022-12-14 ENCOUNTER — Ambulatory Visit (INDEPENDENT_AMBULATORY_CARE_PROVIDER_SITE_OTHER): Payer: 59

## 2022-12-14 VITALS — BP 175/85 | HR 71

## 2022-12-14 DIAGNOSIS — M779 Enthesopathy, unspecified: Secondary | ICD-10-CM | POA: Diagnosis not present

## 2022-12-14 DIAGNOSIS — S9032XA Contusion of left foot, initial encounter: Secondary | ICD-10-CM | POA: Diagnosis not present

## 2022-12-14 NOTE — Patient Instructions (Signed)
VISIT SUMMARY:  During your visit, we discussed your persistent left foot pain, which likely resulted from a kitchen chair falling on your foot several months ago. An X-ray showed no bone damage, suggesting the pain may be due to nerve damage. We also discussed your previous episode of knee swelling, which you believe is due to arthritis.  YOUR PLAN:  -FOOT PAIN: Your foot pain is likely due to nerve damage caused by the chair falling on your foot. We discussed the possibility of a cortisone injection to speed up healing, but you decided to wait. You should continue your normal activities but avoid putting pressure on the affected area.  INSTRUCTIONS:  If your foot pain persists into the new year, please return for a follow-up visit. At that time, we can reconsider the option of a cortisone injection to help with your pain.

## 2022-12-14 NOTE — Progress Notes (Signed)
Subjective:  Patient ID: Susan Jacobs, female    DOB: 1950/11/19,  MRN: [id number]  Chief Complaint  Patient presents with   Foot Pain    "My foot hurts under the toes.  I dropped a chair on it." (3rd Met area left)    Discussed the use of AI scribe software for clinical note transcription with the patient, who gave verbal consent to proceed.  History of Present Illness   The patient, with a history of a foot injury, presents with persistent left foot pain. She reports dropping a kitchen chair on her foot approximately three months ago. The pain is localized to the middle of the foot, behind the second and third toes. The pain is not constant, but is triggered by pressure, such as when her grandson steps on it. She denies any other pain or discomfort in the foot. She also mentions a previous episode of knee swelling, which she attributes to arthritis.          Objective:    Physical Exam   EXTREMITIES: Left foot warm and well perfused with palpable pulses. Mild tenderness over the distal second and third metatarsal area proximal to the metatarsal heads. Small area of old scarring from previous injury. Small area of thickening of the subcutaneous tissue with likely residual hematoma.       No images are attached to the encounter.    Results   RADIOLOGY Left foot X-ray: No fractures or osseous abnormalities (12/13/2022)      Assessment:   1. Contusion of left foot, initial encounter      Plan:  Patient was evaluated and treated and all questions answered.  Assessment and Plan    Foot Pain   Likely nerve contusion secondary to trauma from a kitchen chair falling on her foot several months ago, with an X-ray showing no bone damage. Pain is localized to the area behind the second and third toes. We discussed the option of a cortisone injection to expedite healing, but she opted to wait. She will continue normal activities and avoid pressure on the affected area. She will  return for a follow-up in the new year if the pain persists for a possible cortisone injection.          Return if symptoms worsen or fail to improve.

## 2023-06-14 ENCOUNTER — Encounter: Payer: Self-pay | Admitting: Internal Medicine

## 2023-07-04 ENCOUNTER — Emergency Department

## 2023-07-04 ENCOUNTER — Other Ambulatory Visit: Payer: Self-pay

## 2023-07-04 DIAGNOSIS — I639 Cerebral infarction, unspecified: Secondary | ICD-10-CM | POA: Diagnosis not present

## 2023-07-04 DIAGNOSIS — Z7982 Long term (current) use of aspirin: Secondary | ICD-10-CM | POA: Diagnosis not present

## 2023-07-04 DIAGNOSIS — I10 Essential (primary) hypertension: Secondary | ICD-10-CM | POA: Insufficient documentation

## 2023-07-04 DIAGNOSIS — Z6834 Body mass index (BMI) 34.0-34.9, adult: Secondary | ICD-10-CM | POA: Insufficient documentation

## 2023-07-04 DIAGNOSIS — Z87891 Personal history of nicotine dependence: Secondary | ICD-10-CM | POA: Diagnosis not present

## 2023-07-04 DIAGNOSIS — J449 Chronic obstructive pulmonary disease, unspecified: Secondary | ICD-10-CM | POA: Insufficient documentation

## 2023-07-04 DIAGNOSIS — E785 Hyperlipidemia, unspecified: Secondary | ICD-10-CM | POA: Insufficient documentation

## 2023-07-04 DIAGNOSIS — Z79899 Other long term (current) drug therapy: Secondary | ICD-10-CM | POA: Diagnosis not present

## 2023-07-04 DIAGNOSIS — R200 Anesthesia of skin: Secondary | ICD-10-CM | POA: Diagnosis present

## 2023-07-04 DIAGNOSIS — E669 Obesity, unspecified: Secondary | ICD-10-CM | POA: Insufficient documentation

## 2023-07-04 DIAGNOSIS — Z7902 Long term (current) use of antithrombotics/antiplatelets: Secondary | ICD-10-CM | POA: Insufficient documentation

## 2023-07-04 DIAGNOSIS — K219 Gastro-esophageal reflux disease without esophagitis: Secondary | ICD-10-CM | POA: Diagnosis not present

## 2023-07-04 LAB — COMPREHENSIVE METABOLIC PANEL WITH GFR
ALT: 12 U/L (ref 0–44)
AST: 24 U/L (ref 15–41)
Albumin: 3.3 g/dL — ABNORMAL LOW (ref 3.5–5.0)
Alkaline Phosphatase: 87 U/L (ref 38–126)
Anion gap: 12 (ref 5–15)
BUN: 29 mg/dL — ABNORMAL HIGH (ref 8–23)
CO2: 22 mmol/L (ref 22–32)
Calcium: 9.1 mg/dL (ref 8.9–10.3)
Chloride: 108 mmol/L (ref 98–111)
Creatinine, Ser: 1.67 mg/dL — ABNORMAL HIGH (ref 0.44–1.00)
GFR, Estimated: 33 mL/min — ABNORMAL LOW (ref >60)
Glucose, Bld: 95 mg/dL (ref 70–99)
Potassium: 4.4 mmol/L (ref 3.5–5.1)
Sodium: 142 mmol/L (ref 135–145)
Total Bilirubin: 1 mg/dL (ref 0.0–1.2)
Total Protein: 7.5 g/dL (ref 6.5–8.1)

## 2023-07-04 LAB — CBC WITH DIFFERENTIAL/PLATELET
Abs Immature Granulocytes: 0.03 10*3/uL (ref 0.00–0.07)
Basophils Absolute: 0.1 10*3/uL (ref 0.0–0.1)
Basophils Relative: 1 %
Eosinophils Absolute: 0.2 10*3/uL (ref 0.0–0.5)
Eosinophils Relative: 2 %
HCT: 37.4 % (ref 36.0–46.0)
Hemoglobin: 11.4 g/dL — ABNORMAL LOW (ref 12.0–15.0)
Immature Granulocytes: 0 %
Lymphocytes Relative: 24 %
Lymphs Abs: 2.2 10*3/uL (ref 0.7–4.0)
MCH: 26 pg (ref 26.0–34.0)
MCHC: 30.5 g/dL (ref 30.0–36.0)
MCV: 85.2 fL (ref 80.0–100.0)
Monocytes Absolute: 0.7 10*3/uL (ref 0.1–1.0)
Monocytes Relative: 7 %
Neutro Abs: 6 10*3/uL (ref 1.7–7.7)
Neutrophils Relative %: 66 %
Platelets: 325 10*3/uL (ref 150–400)
RBC: 4.39 MIL/uL (ref 3.87–5.11)
RDW: 15.9 % — ABNORMAL HIGH (ref 11.5–15.5)
WBC: 9.1 10*3/uL (ref 4.0–10.5)
nRBC: 0 % (ref 0.0–0.2)

## 2023-07-04 LAB — PROTIME-INR
INR: 0.9 (ref 0.8–1.2)
Prothrombin Time: 12.6 s (ref 11.4–15.2)

## 2023-07-04 LAB — APTT: aPTT: 30 s (ref 24–36)

## 2023-07-04 LAB — ETHANOL: Alcohol, Ethyl (B): <15 mg/dL (ref <15)

## 2023-07-04 NOTE — ED Triage Notes (Signed)
 POV with CC of L sided numbness since midnight (19 hours before now). Hx of stroke that caused pt to only have half vision in L eye. No slurred speech. A&Ox4.

## 2023-07-04 NOTE — ED Triage Notes (Signed)
 First Nurse Note: Patient to ED c/o left sided numbness. LWK midnight last night.

## 2023-07-05 ENCOUNTER — Observation Stay
Admission: EM | Admit: 2023-07-05 | Discharge: 2023-07-06 | Disposition: A | Discharge status: Home / Self Care | Attending: Internal Medicine | Admitting: Internal Medicine

## 2023-07-05 ENCOUNTER — Encounter: Payer: Self-pay | Admitting: Internal Medicine

## 2023-07-05 ENCOUNTER — Observation Stay

## 2023-07-05 ENCOUNTER — Emergency Department

## 2023-07-05 DIAGNOSIS — I6381 Other cerebral infarction due to occlusion or stenosis of small artery: Secondary | ICD-10-CM | POA: Diagnosis not present

## 2023-07-05 DIAGNOSIS — I6389 Other cerebral infarction: Secondary | ICD-10-CM | POA: Diagnosis not present

## 2023-07-05 DIAGNOSIS — I639 Cerebral infarction, unspecified: Secondary | ICD-10-CM | POA: Diagnosis not present

## 2023-07-05 DIAGNOSIS — E785 Hyperlipidemia, unspecified: Secondary | ICD-10-CM | POA: Diagnosis not present

## 2023-07-05 DIAGNOSIS — I10 Essential (primary) hypertension: Secondary | ICD-10-CM | POA: Diagnosis not present

## 2023-07-05 DIAGNOSIS — J449 Chronic obstructive pulmonary disease, unspecified: Secondary | ICD-10-CM | POA: Diagnosis not present

## 2023-07-05 DIAGNOSIS — I739 Peripheral vascular disease, unspecified: Secondary | ICD-10-CM

## 2023-07-05 DIAGNOSIS — K219 Gastro-esophageal reflux disease without esophagitis: Secondary | ICD-10-CM

## 2023-07-05 DIAGNOSIS — E66811 Obesity, class 1: Secondary | ICD-10-CM

## 2023-07-05 DIAGNOSIS — R200 Anesthesia of skin: Secondary | ICD-10-CM

## 2023-07-05 LAB — HEMOGLOBIN A1C
Hgb A1c MFr Bld: 6.1 % — ABNORMAL HIGH (ref 4.8–5.6)
Mean Plasma Glucose: 128.37 mg/dL

## 2023-07-05 MED ORDER — ACETAMINOPHEN 650 MG RE SUPP: 650.0000 mg | Freq: Four times a day (QID) | RECTAL | Status: DC | PRN

## 2023-07-05 MED ORDER — MAGNESIUM HYDROXIDE 400 MG/5ML PO SUSP: 30.0000 mL | Freq: Every day | ORAL | Status: DC | PRN

## 2023-07-05 MED ORDER — ALBUTEROL SULFATE (2.5 MG/3ML) 0.083% IN NEBU: 2.5000 mg | INHALATION_SOLUTION | RESPIRATORY_TRACT | Status: DC | PRN

## 2023-07-05 MED ORDER — IOHEXOL 350 MG/ML SOLN
50.0000 mL | Freq: Once | INTRAVENOUS | Status: AC | PRN
  Administered 2023-07-05: 50 mL via INTRAVENOUS

## 2023-07-05 MED ORDER — LOSARTAN POTASSIUM 50 MG PO TABS: 50.0000 mg | ORAL_TABLET | Freq: Every day | ORAL | Status: DC

## 2023-07-05 MED ORDER — CLOPIDOGREL BISULFATE 75 MG PO TABS
75.0000 mg | ORAL_TABLET | Freq: Every day | ORAL | Status: DC
  Administered 2023-07-05 – 2023-07-06 (×2): 75 mg via ORAL
  Filled 2023-07-05 (×2): qty 1

## 2023-07-05 MED ORDER — TIOTROPIUM BROMIDE MONOHYDRATE 18 MCG IN CAPS
18.0000 ug | ORAL_CAPSULE | Freq: Every day | RESPIRATORY_TRACT | Status: DC
Start: 2023-07-05 — End: 2023-07-05

## 2023-07-05 MED ORDER — ONDANSETRON HCL 4 MG/2ML IJ SOLN: 4.0000 mg | Freq: Four times a day (QID) | INTRAMUSCULAR | Status: DC | PRN

## 2023-07-05 MED ORDER — ATORVASTATIN CALCIUM 20 MG PO TABS
80.0000 mg | ORAL_TABLET | Freq: Every day | ORAL | Status: DC
  Administered 2023-07-05 – 2023-07-06 (×2): 80 mg via ORAL
  Filled 2023-07-05 (×2): qty 4

## 2023-07-05 MED ORDER — TRAZODONE HCL 50 MG PO TABS: 25.0000 mg | ORAL_TABLET | Freq: Every evening | ORAL | Status: DC | PRN

## 2023-07-05 MED ORDER — ACETAMINOPHEN 325 MG PO TABS: 650.0000 mg | ORAL_TABLET | Freq: Four times a day (QID) | ORAL | Status: DC | PRN

## 2023-07-05 MED ORDER — ENOXAPARIN SODIUM 40 MG/0.4ML IJ SOSY: 40.0000 mg | PREFILLED_SYRINGE | INTRAMUSCULAR | Status: DC

## 2023-07-05 MED ORDER — HYDROCHLOROTHIAZIDE 12.5 MG PO TABS: 12.5000 mg | ORAL_TABLET | Freq: Every day | ORAL | Status: DC

## 2023-07-05 MED ORDER — ONDANSETRON HCL 4 MG PO TABS: 4.0000 mg | ORAL_TABLET | Freq: Four times a day (QID) | ORAL | Status: DC | PRN

## 2023-07-05 MED ORDER — ASPIRIN 81 MG PO TBEC
81.0000 mg | DELAYED_RELEASE_TABLET | Freq: Every day | ORAL | Status: DC
  Administered 2023-07-06: 81 mg via ORAL
  Filled 2023-07-05: qty 1

## 2023-07-05 MED ORDER — ASPIRIN 81 MG PO CHEW
324.0000 mg | CHEWABLE_TABLET | Freq: Once | ORAL | Status: AC
  Administered 2023-07-05: 324 mg via ORAL
  Filled 2023-07-05: qty 4

## 2023-07-05 MED ORDER — METHOCARBAMOL 500 MG PO TABS
1000.0000 mg | ORAL_TABLET | Freq: Once | ORAL | Status: AC
  Administered 2023-07-05: 1000 mg via ORAL
  Filled 2023-07-05: qty 2

## 2023-07-05 MED ORDER — STROKE: EARLY STAGES OF RECOVERY BOOK: Freq: Once | Status: AC

## 2023-07-05 MED ORDER — MONTELUKAST SODIUM 10 MG PO TABS
10.0000 mg | ORAL_TABLET | Freq: Every day | ORAL | Status: DC
  Administered 2023-07-05 – 2023-07-06 (×2): 10 mg via ORAL
  Filled 2023-07-05 (×2): qty 1

## 2023-07-05 MED ORDER — SODIUM CHLORIDE 0.9 % IV SOLN: INTRAVENOUS | Status: DC

## 2023-07-05 MED ORDER — SIMVASTATIN 10 MG PO TABS
40.0000 mg | ORAL_TABLET | Freq: Every day | ORAL | Status: DC
Start: 2023-07-05 — End: 2023-07-05

## 2023-07-05 MED ORDER — PANTOPRAZOLE SODIUM 40 MG PO TBEC
40.0000 mg | DELAYED_RELEASE_TABLET | Freq: Every day | ORAL | Status: DC
  Administered 2023-07-05 – 2023-07-06 (×2): 40 mg via ORAL
  Filled 2023-07-05 (×2): qty 1

## 2023-07-05 NOTE — Assessment & Plan Note (Signed)
 Body mass index is 31.24 kg/m.

## 2023-07-05 NOTE — Assessment & Plan Note (Addendum)
 07-05-2023  Will continue PPI therapy.  07-06-2023 stable.

## 2023-07-05 NOTE — H&P (Addendum)
 [place]   PATIENT NAME: Susan Jacobs    MR#:  [id number]  DATE OF BIRTH:  1951/01/24  DATE OF ADMISSION:  07/05/2023  PRIMARY CARE PHYSICIAN: [facility]   Patient is coming from: Home  REQUESTING/REFERRING PHYSICIAN: Twilla Galea, MD  CHIEF COMPLAINT:   Chief Complaint  Patient presents with   Numbness    HISTORY OF PRESENT ILLNESS:  Henry B Kaine is a 73 y.o. African-American female with medical history significant for asthma, COPD, dyslipidemia and hypertension as well as GI bleeding, who presented to the emergency room with acute onset of left-sided numbness that started after midnight the night before last with associated tingling.  She denied any focal muscle weakness or facial droop.  No tinnitus or vertigo.  No witnessed seizures.  Notes dysphagia or dysarthria or visual changes.  No fever or chills.  No cough or wheezing or dyspnea.  No chest pain or palpitations.  She denied any nausea or vomiting or abdominal pain.  No bleeding diathesis.  ED Course: When she came to the ER, BP was 133/80 and later 157/73 with otherwise normal vital signs labs revealed BUN 29 with a creatinine 0.67, albumin 3.3.  CBC showed hemoglobin 11.4 hematocrit 37.4.  Coag profile was within normal. EKG as reviewed by me : EKG showed EKG showed normal sinus rhythm with a rate of 82 with nonspecific T wave abnormality. Imaging: Noncontrast head CT scan revealed no acute intracranial abnormalities.  CTA of the head and neck revealed atherosclerosis without flow reducing stenosis or ulceration admission arteries in the head and neck and no emergent arterial finding. - Brain MRI revealed punctate acute infarct in the right thalamus.  The patient was given 4 IV aspirin .  She will be admitted to a medical telemetry observation bed for further evaluation and management. PAST MEDICAL HISTORY:   Past Medical History:  Diagnosis Date   Anemia    Asthma    COPD  (chronic obstructive pulmonary disease) (HCC)    GI bleed    Heart murmur    Hypercholesteremia    Hypertension     PAST SURGICAL HISTORY:   Past Surgical History:  Procedure Laterality Date   HERNIA REPAIR     TUBAL LIGATION      SOCIAL HISTORY:   Social History   Tobacco Use   Smoking status: Former    Current packs/day: 0.00    Types: Cigarettes    Quit date: 03/05/2018    Years since quitting: 5.3   Smokeless tobacco: Never  Substance Use Topics   Alcohol use: Yes    Alcohol/week: 3.0 standard drinks of alcohol    Types: 3 Cans of beer per week    Comment: 2-3 times a week    FAMILY HISTORY:   Family History  Problem Relation Age of Onset   Pancreatic cancer Mother    Throat cancer Father    Colon cancer Father    Esophageal cancer Sister    Breast cancer Neg Hx     DRUG ALLERGIES:   Allergies  Allergen Reactions   Lisinopril Other (See Comments) and Hives    Other reaction(s): Unknown   Nsaids     Other reaction(s): Unknown   Aspirin  Palpitations and Other (See Comments)    Pt states that she is able to use the lower dose.      REVIEW OF SYSTEMS:   ROS As per history of present illness. All pertinent systems were reviewed  above. Constitutional, HEENT, cardiovascular, respiratory, GI, GU, musculoskeletal, neuro, psychiatric, endocrine, integumentary and hematologic systems were reviewed and are otherwise negative/unremarkable except for positive findings mentioned above in the HPI.   MEDICATIONS AT HOME:   Prior to Admission medications   Medication Sig Start Date End Date Taking? Authorizing Provider  acetaminophen  (TYLENOL ) 500 MG tablet Take 1,000 mg by mouth every 6 (six) hours as needed for mild pain, fever or headache. Patient not taking: Reported on 02/26/2021    [provider]  albuterol  (PROVENTIL  HFA) 108 (90 Base) MCG/ACT inhaler Inhale 2 puffs into the lungs every 4 (four) hours as needed for wheezing or shortness of breath.  02/15/17   Jacquie Maudlin, MD  albuterol  (PROVENTIL ) (2.5 MG/3ML) 0.083% nebulizer solution Take 3 mLs (2.5 mg total) by nebulization every 4 (four) hours as needed for wheezing or shortness of breath. 03/06/18   Sung, Jade J, MD  aspirin  EC 81 MG tablet Take 81 mg by mouth daily.    [provider]  Baclofen  5 MG TABS Take 5 mg by mouth 3 (three) times daily as needed. Patient not taking: Reported on 02/26/2021 12/12/18   Driscilla George, PA-C  beclomethasone (QVAR ) 40 MCG/ACT inhaler Inhale 2 puffs into the lungs 2 (two) times daily. Patient not taking: Reported on 02/26/2021    [provider]  Coenzyme Q10 (COQ10) 100 MG CAPS Take 100 mg by mouth daily. Patient not taking: Reported on 02/26/2021    [provider]  Fluticasone-Salmeterol (ADVAIR) 250-50 MCG/DOSE AEPB  02/08/19   [provider]  hydrochlorothiazide  (MICROZIDE ) 12.5 MG capsule Take 12.5 mg by mouth daily.    [provider]  HYDROcodone -acetaminophen  (NORCO/VICODIN) 5-325 MG tablet Take 1 tablet by mouth every 6 (six) hours as needed for severe pain (no more than 6 tabs daily). 11/29/22 11/29/23  Loman Risk, MD  lidocaine  (LIDODERM ) 5 % Place 1 patch onto the skin daily. Remove & Discard patch within 12 hours or as directed by MD Patient not taking: Reported on 02/26/2021 12/12/18   Driscilla George, PA-C  losartan  (COZAAR ) 50 MG tablet Take 50 mg by mouth daily. 12/25/18   [provider]  montelukast (SINGULAIR) 10 MG tablet Take 10 mg by mouth daily. 12/24/18   [provider]  Nebulizers (COMPRESSOR/NEBULIZER) MISC 1 Units by Does not apply route every 4 (four) hours as needed. Patient not taking: Reported on 02/26/2021 03/06/18   Sung, Jade J, MD  nicotine  (NICODERM CQ  - DOSED IN MG/24 HOURS) 14 mg/24hr patch Place 1 patch (14 mg total) onto the skin daily. Patient not taking: Reported on 02/26/2021 08/22/15   Verla Glaze, MD  omeprazole  (PRILOSEC  OTC) 20  MG tablet Take 2 tablets (40 mg total) by mouth daily. 11/08/20 12/08/20  Jacquie Maudlin, MD  omeprazole  (PRILOSEC ) 20 MG capsule Take 20 mg by mouth daily.    [provider]  ondansetron  (ZOFRAN -ODT) 4 MG disintegrating tablet Take 1 tablet (4 mg total) by mouth every 8 (eight) hours as needed. 11/29/22   Loman Risk, MD  simvastatin  (ZOCOR ) 40 MG tablet Take 40 mg by mouth at bedtime. 12/25/18   [provider]  Sodium Sulfate-Mag Sulfate-KCl (SUTAB ) [id number] MG TABS Per instructions given Patient not taking: Reported on 02/26/2021 01/28/21   Tahiliani, Varnita B, MD  tiotropium (SPIRIVA ) 18 MCG inhalation capsule Place 1 capsule (18 mcg total) into inhaler and inhale daily. Patient not taking: Reported on 02/26/2021 03/14/18   Mauri Sous, MD  VITAL SIGNS:  Blood pressure (!) 145/78, pulse 77, temperature 98.4 F (36.9 C), temperature source Oral, resp. rate 11, height 5\' 4"  (1.626 m), weight 82.6 kg, SpO2 100%.  PHYSICAL EXAMINATION:  Physical Exam  GENERAL:  73 y.o.-year-old African-American female patient lying in the bed with no acute distress.  EYES: Pupils equal, round, reactive to light and accommodation. No scleral icterus. Extraocular muscles intact.  HEENT: Head atraumatic, normocephalic. Oropharynx and nasopharynx clear.  NECK:  Supple, no jugular venous distention. No thyroid enlargement, no tenderness.  LUNGS: Normal breath sounds bilaterally, no wheezing, rales,rhonchi or crepitation. No use of accessory muscles of respiration.  CARDIOVASCULAR: Regular rate and rhythm, S1, S2 normal. No murmurs, rubs, or gallops.  ABDOMEN: Soft, nondistended, nontender. Bowel sounds present. No organomegaly or mass.  EXTREMITIES: No pedal edema, cyanosis, or clubbing.  NEUROLOGIC: Cranial nerves II through XII are intact. Muscle strength 5/5 in all extremities. Sensation diminished to light touch on the left face only and resolved over the left upper and lower  extremity .  Gait not checked.  PSYCHIATRIC: The patient is alert and oriented x 3.  Normal affect and good eye contact. SKIN: No obvious rash, lesion, or ulcer.   LABORATORY PANEL:   CBC Recent Labs  Lab 07/04/23 1923  WBC 9.1  HGB 11.4*  HCT 37.4  PLT 325   ------------------------------------------------------------------------------------------------------------------  Chemistries  Recent Labs  Lab 07/04/23 1923  NA 142  K 4.4  CL 108  CO2 22  GLUCOSE 95  BUN 29*  CREATININE 1.67*  CALCIUM 9.1  AST 24  ALT 12  ALKPHOS 87  BILITOT 1.0   ------------------------------------------------------------------------------------------------------------------  Cardiac Enzymes No results for input(s): "TROPONINI" in the last 168 hours. ------------------------------------------------------------------------------------------------------------------  RADIOLOGY:  CT ANGIO HEAD NECK W WO CM Result Date: 07/05/2023 CLINICAL DATA:  Stroke, determine embolic source. EXAM: CT ANGIOGRAPHY HEAD AND NECK WITH AND WITHOUT CONTRAST TECHNIQUE: Multidetector CT imaging of the head and neck was performed using the standard protocol during bolus administration of intravenous contrast. Multiplanar CT image reconstructions and MIPs were obtained to evaluate the vascular anatomy. Carotid stenosis measurements (when applicable) are obtained utilizing NASCET criteria, using the distal internal carotid diameter as the denominator. RADIATION DOSE REDUCTION: This exam was performed according to the departmental dose-optimization program which includes automated exposure control, adjustment of the mA and/or kV according to patient size and/or use of iterative reconstruction technique. CONTRAST:  50mL OMNIPAQUE  IOHEXOL  350 MG/ML SOLN COMPARISON:  Brain MRI from earlier today FINDINGS: CT HEAD FINDINGS Brain: The patient's acute infarcts by MRI is occult by CT. No hemorrhage, hydrocephalus, or masslike finding  Vascular: No hyperdense vessel or unexpected calcification. Skull: Normal. Negative for fracture or focal lesion. Sinuses/Orbits: No acute finding. Review of the MIP images confirms the above findings CTA NECK FINDINGS Aortic arch: Atheromatous plaque that is extensive. Two vessel branching Right carotid system: Diffuse atheromatous wall thickening with prominent low-density plaque at the bifurcation. No focal flow reducing stenosis, ulceration, or beading. Mild vessel blurring at the proximal ICA where there is some motion. Left carotid system: Diffuse atheromatous wall thickening with accentuated mixed density plaque at the bifurcation. No flow reducing stenosis, ulceration, or beading Vertebral arteries: Proximal subclavian atherosclerosis greater on the left. The vertebral arteries are smoothly contoured and widely patent to the dura. Skeleton: No acute finding Other neck: No acute or aggressive finding. Small left occipital lipoma. Upper chest: No acute finding Review of the MIP images confirms the above findings CTA HEAD FINDINGS  Anterior circulation: Atheromatous calcification of the cervical carotids. No branch occlusion, beading, or aneurysm. Posterior circulation: Fetal type right PCA. The vertebral and basilar arteries are diffusely patent. No branch occlusion, beading, or aneurysm. Venous sinuses: Unremarkable Anatomic variants: None significant Review of the MIP images confirms the above findings IMPRESSION: No emergent arterial finding. Atherosclerosis without flow reducing stenosis or ulceration of major arteries in the head and neck. Electronically Signed   By: Ronnette Coke M.D.   On: 07/05/2023 07:24   MR BRAIN WO CONTRAST Result Date: 07/05/2023 CLINICAL DATA:  Neuro deficit, acute, stroke suspected EXAM: MRI HEAD WITHOUT CONTRAST TECHNIQUE: Multiplanar, multiecho pulse sequences of the brain and surrounding structures were obtained without intravenous contrast. COMPARISON:  CT head Jul 04, 2023. FINDINGS: Brain: Punctate acute infarct in the right thalamus. No mass effect. No midline shift. No evidence of acute hemorrhage, hydrocephalus or extra-axial fluid collection. Mild for age chronic microvascular ischemic change. Vascular: Major arterial flow voids are maintained at the skull base. Skull and upper cervical spine: Normal marrow signal. Sinuses/Orbits: Clear sinuses.  No acute orbital findings. Other: No mastoid effusions. IMPRESSION: Punctate acute infarct in the right thalamus. Electronically Signed   By: Stevenson Elbe M.D.   On: 07/05/2023 03:34   CT HEAD WO CONTRAST Result Date: 07/04/2023 CLINICAL DATA:  Altered mental status EXAM: CT HEAD WITHOUT CONTRAST TECHNIQUE: Contiguous axial images were obtained from the base of the skull through the vertex without intravenous contrast. RADIATION DOSE REDUCTION: This exam was performed according to the departmental dose-optimization program which includes automated exposure control, adjustment of the mA and/or kV according to patient size and/or use of iterative reconstruction technique. COMPARISON:  None Available. FINDINGS: Brain: No acute intracranial abnormality. Specifically, no hemorrhage, hydrocephalus, mass lesion, acute infarction, or significant intracranial injury. Vascular: No hyperdense vessel or unexpected calcification. Skull: No acute calvarial abnormality. Sinuses/Orbits: No acute findings Other: None IMPRESSION: No acute intracranial abnormality. Electronically Signed   By: Janeece Mechanic M.D.   On: 07/04/2023 20:10      IMPRESSION AND PLAN:  Assessment and Plan: * Right thalamic infarction [facility]) - This is associated with left-sided numbness. - The patient will be admitted to an observation medically monitored bed.   - We will follow neuro checks q.4 hours for 24 hours.   - The patient will be placed on aspirin .   - Will obtain a 2D echo with bubble study .   - A neurology consultation  as well as  physical/occupation/speech therapy consults will be obtained in a.m.Aaron Aas - I notified Dr. Doretta Gant about the patient. - The patient will be placed on statin therapy and fasting lipids will be checked.   Chronic obstructive pulmonary disease (COPD) (HCC) - Will continue his nebulized therapy with albuterol   GERD without esophagitis - Will continue PPI therapy.  Essential hypertension - Will continue antihypertensive therapy.  Dyslipidemia - Will continue statin therapy.   DVT prophylaxis: Lovenox .  Advanced Care Planning:  Code Status: full code.  Family Communication:  The plan of care was discussed in details with the patient (and family). I answered all questions. The patient agreed to proceed with the above mentioned plan. Further management will depend upon hospital course. Disposition Plan: Back to previous home environment Consults called: Neurology All the records are reviewed and case discussed with ED provider.  Status is: Observation  I certify that at the time of admission, it is my clinical judgment that the patient will require  hospital care extending less  than 2 midnights.                            Dispo: The patient is from: Home              Anticipated d/c is to: Home              Patient currently is not medically stable to d/c.              Difficult to place patient: No  Virgene Griffin M.D on 07/05/2023 at 8:15 AM  [facility]   From 7 PM-7 AM, contact night-coverage [email or web address]  CC: Primary care physician; [facility]

## 2023-07-05 NOTE — Evaluation (Signed)
 Physical Therapy Evaluation Patient Details Name: Susan Jacobs MRN: [id number] DOB: November 04, 1950 Today's Date: 07/05/2023  History of Present Illness  73 y/o female presented to ED on 07/04/23 for L sided numbness. MRI revealed punctate acute infarct in R thalamus. PMH: COPD, HTN, hx of CVA  Clinical Impression  Patient admitted with the above. PTA, patient lives alone and independent. Currently, patient continues to function at independent level. Denies numbness in L UE or LE, but endorses residual numbness in L side of bottom lip. Educated patient on stroke s/s (BE FAST acronym), patient verbalized understanding. No further skilled PT needs identified acutely. No PT follow up recommended at this time.         If plan is discharge home, recommend the following:     Can travel by private vehicle        Equipment Recommendations None recommended by PT  Recommendations for Other Services       Functional Status Assessment Patient has not had a recent decline in their functional status     Precautions / Restrictions Precautions Precautions: None Recall of Precautions/Restrictions: Intact Restrictions Weight Bearing Restrictions Per Provider Order: No      Mobility  Bed Mobility Overal bed mobility: Independent                  Transfers Overall transfer level: Independent Equipment used: None                    Ambulation/Gait Ambulation/Gait assistance: Independent Gait Distance (Feet): 200 Feet Assistive device: None Gait Pattern/deviations: WFL(Within Functional Limits)   Gait velocity interpretation: >4.37 ft/sec, indicative of normal walking speed      Stairs            Wheelchair Mobility     Tilt Bed    Modified Rankin (Stroke Patients Only)       Balance Overall balance assessment: Independent                                           Pertinent Vitals/Pain Pain Assessment Pain Assessment: No/denies pain     Home Living Family/patient expects to be discharged to:: Private residence Living Arrangements: Alone   Type of Home: House Home Access: Stairs to enter Entrance Stairs-Rails: Doctor, general practice of Steps: 6   Home Layout: One level Home Equipment: Cane - single point      Prior Function Prior Level of Function : Independent/Modified Independent;Driving             Mobility Comments: cares for her 7 year old grandchild daily       Extremity/Trunk Assessment   Upper Extremity Assessment Upper Extremity Assessment: Overall WFL for tasks assessed    Lower Extremity Assessment Lower Extremity Assessment: Overall WFL for tasks assessed    Cervical / Trunk Assessment Cervical / Trunk Assessment: Normal  Communication   Communication Communication: No apparent difficulties    Cognition Arousal: Alert Behavior During Therapy: WFL for tasks assessed/performed   PT - Cognitive impairments: No apparent impairments                         Following commands: Intact       Cueing       General Comments      Exercises     Assessment/Plan    PT Assessment Patient  does not need any further PT services  PT Problem List         PT Treatment Interventions      PT Goals (Current goals can be found in the Care Plan section)  Acute Rehab PT Goals Patient Stated Goal: to go home PT Goal Formulation: All assessment and education complete, DC therapy    Frequency       Co-evaluation               AM-PAC PT "6 Clicks" Mobility  Outcome Measure Help needed turning from your back to your side while in a flat bed without using bedrails?: None Help needed moving from lying on your back to sitting on the side of a flat bed without using bedrails?: None Help needed moving to and from a bed to a chair (including a wheelchair)?: None Help needed standing up from a chair using your arms (e.g., wheelchair or bedside chair)?: None Help  needed to walk in hospital room?: None Help needed climbing 3-5 steps with a railing? : None 6 Click Score: 24    End of Session   Activity Tolerance: Patient tolerated treatment well Patient left: in bed;with call bell/phone within reach Nurse Communication: Mobility status PT Visit Diagnosis: Muscle weakness (generalized) (M62.81)    Time: 2440-1027 PT Time Calculation (min) (ACUTE ONLY): 11 min   Charges:   PT Evaluation $PT Eval Low Complexity: 1 Low   PT General Charges $$ ACUTE PT VISIT: 1 Visit         Janine Melbourne, PT, DPT Physical Therapist - [facility]  [facility]   Rawad Bochicchio A Linzi Ohlinger 07/05/2023, 10:36 AM

## 2023-07-05 NOTE — Assessment & Plan Note (Addendum)
 07-05-2023 Will continue statin therapy.  07-06-2023 continue lipitor 80 mg daily.

## 2023-07-05 NOTE — Progress Notes (Signed)
 PROGRESS NOTE    Susan Jacobs  ZOX:[id number] DOB: 1951/01/04 DOA: 07/05/2023 PCP: [facility]  Subjective: Pt seen and examined. Pt aware of her MRI brain report and small right thalamic CVA noted. Pt came into ER with left sided numbness of face, left UE and left LE. This is now all resolved except for some slight numbness of her left side of her lips. Pt able to eat breakfast this AM without dysphagia.  Awaiting bubble echo, PT/OT/ST and neurology consult.  Pt aware that she may be able to go home today.  Started on ASA and plavix. Awaiting lipid panel.   Hospital Course: HPI:   Significant Events: Admitted 07/05/2023 for right thalamic CVA   Significant Labs: WBC 9.1, HGB 11.4, plt 325 Na 142, K 4.4, CO2 of 22, BUN 29, Scr 1.67, glu 95  Significant Imaging Studies: CT head No acute intracranial abnormality.  MRI brain Punctate acute infarct in the right thalamus  CTA head/neck  No emergent arterial finding. Atherosclerosis without flow reducing stenosis or ulceration of major arteries in the head and neck.  Antibiotic Therapy: Anti-infectives (From admission, onward)    None       Procedures:   Consultants:     Assessment and Plan: * Right thalamic infarction (HCC) On admission. - This is associated with left-sided numbness. - The patient will be admitted to an observation medically monitored bed.   - We will follow neuro checks q.4 hours for 24 hours.   - The patient will be placed on aspirin .   - Will obtain a 2D echo with bubble study .   - A neurology consultation  as well as physical/occupation/speech therapy consults will be obtained in a.m.Aaron Aas - I notified Dr. Doretta Gant about the patient. - The patient will be placed on statin therapy and fasting lipids will be checked.  07-05-2023 Awaiting bubble echo, PT/OT/ST and neurology consult. Pt aware that she may be able to go home today. Started on ASA and plavix. Awaiting lipid  panel.  Essential hypertension 07-05-2023 hold HTN meds for now to allow for permissive HTN until pt can be seen by neurology.  Obesity, Class I, BMI 30-34.9 Body mass index is 31.24 kg/m.   Chronic obstructive pulmonary disease (COPD) (HCC) 07-05-2023  Will continue his nebulized therapy with albuterol   GERD without esophagitis 07-05-2023  Will continue PPI therapy.  Dyslipidemia 07-05-2023 Will continue statin therapy.      DVT prophylaxis: enoxaparin  (LOVENOX ) injection 40 mg Start: 07/05/23 2200 SCDs Start: 07/05/23 8119    Code Status: Full Code Family Communication: no family at bedside. Pt is decisional Disposition Plan: return home Reason for continuing need for hospitalization: awaiting PT/OT/ST, echo and neurology consult.  Objective: Vitals:   07/05/23 0500 07/05/23 0600 07/05/23 0700 07/05/23 0800  BP: (!) 117/55 (!) 110/53 125/77 (!) 145/78  Pulse: 78 79 77 77  Resp: 16 15 19 11   Temp:    98.2 F (36.8 C)  TempSrc:    Oral  SpO2: 99% 98% 100% 100%  Weight:      Height:       No intake or output data in the 24 hours ending 07/05/23 0954 Filed Weights   07/04/23 1915  Weight: 82.6 kg    Examination:  Physical Exam Vitals and nursing note reviewed.  Constitutional:      General: She is not in acute distress.    Appearance: She is obese. She is not toxic-appearing or diaphoretic.  HENT:  Head: Normocephalic and atraumatic.     Nose: Nose normal.  Eyes:     General: No scleral icterus. Cardiovascular:     Rate and Rhythm: Normal rate and regular rhythm.  Pulmonary:     Effort: Pulmonary effort is normal. No respiratory distress.     Breath sounds: Normal breath sounds. No wheezing.  Abdominal:     General: Bowel sounds are normal. There is no distension.     Palpations: Abdomen is soft.  Musculoskeletal:     Right lower leg: No edema.     Left lower leg: No edema.  Skin:    General: Skin is warm and dry.     Capillary Refill:  Capillary refill takes less than 2 seconds.  Neurological:     Mental Status: She is alert and oriented to person, place, and time.     Data Reviewed: I have personally reviewed following labs and imaging studies  CBC: Recent Labs  Lab 07/04/23 1923  WBC 9.1  NEUTROABS 6.0  HGB 11.4*  HCT 37.4  MCV 85.2  PLT 325   Basic Metabolic Panel: Recent Labs  Lab 07/04/23 1923  NA 142  K 4.4  CL 108  CO2 22  GLUCOSE 95  BUN 29*  CREATININE 1.67*  CALCIUM 9.1   GFR: Estimated Creatinine Clearance: 32.6 mL/min (A) (by C-G formula based on SCr of 1.67 mg/dL (H)). Liver Function Tests: Recent Labs  Lab 07/04/23 1923  AST 24  ALT 12  ALKPHOS 87  BILITOT 1.0  PROT 7.5  ALBUMIN 3.3*   Coagulation Profile: Recent Labs  Lab 07/04/23 1923  INR 0.9   Radiology Studies: CT ANGIO HEAD NECK W WO CM Result Date: 07/05/2023 CLINICAL DATA:  Stroke, determine embolic source. EXAM: CT ANGIOGRAPHY HEAD AND NECK WITH AND WITHOUT CONTRAST TECHNIQUE: Multidetector CT imaging of the head and neck was performed using the standard protocol during bolus administration of intravenous contrast. Multiplanar CT image reconstructions and MIPs were obtained to evaluate the vascular anatomy. Carotid stenosis measurements (when applicable) are obtained utilizing NASCET criteria, using the distal internal carotid diameter as the denominator. RADIATION DOSE REDUCTION: This exam was performed according to the departmental dose-optimization program which includes automated exposure control, adjustment of the mA and/or kV according to patient size and/or use of iterative reconstruction technique. CONTRAST:  50mL OMNIPAQUE  IOHEXOL  350 MG/ML SOLN COMPARISON:  Brain MRI from earlier today FINDINGS: CT HEAD FINDINGS Brain: The patient's acute infarcts by MRI is occult by CT. No hemorrhage, hydrocephalus, or masslike finding Vascular: No hyperdense vessel or unexpected calcification. Skull: Normal. Negative for  fracture or focal lesion. Sinuses/Orbits: No acute finding. Review of the MIP images confirms the above findings CTA NECK FINDINGS Aortic arch: Atheromatous plaque that is extensive. Two vessel branching Right carotid system: Diffuse atheromatous wall thickening with prominent low-density plaque at the bifurcation. No focal flow reducing stenosis, ulceration, or beading. Mild vessel blurring at the proximal ICA where there is some motion. Left carotid system: Diffuse atheromatous wall thickening with accentuated mixed density plaque at the bifurcation. No flow reducing stenosis, ulceration, or beading Vertebral arteries: Proximal subclavian atherosclerosis greater on the left. The vertebral arteries are smoothly contoured and widely patent to the dura. Skeleton: No acute finding Other neck: No acute or aggressive finding. Small left occipital lipoma. Upper chest: No acute finding Review of the MIP images confirms the above findings CTA HEAD FINDINGS Anterior circulation: Atheromatous calcification of the cervical carotids. No branch occlusion, beading, or aneurysm. Posterior  circulation: Fetal type right PCA. The vertebral and basilar arteries are diffusely patent. No branch occlusion, beading, or aneurysm. Venous sinuses: Unremarkable Anatomic variants: None significant Review of the MIP images confirms the above findings IMPRESSION: No emergent arterial finding. Atherosclerosis without flow reducing stenosis or ulceration of major arteries in the head and neck. Electronically Signed   By: Ronnette Coke M.D.   On: 07/05/2023 07:24   MR BRAIN WO CONTRAST Result Date: 07/05/2023 CLINICAL DATA:  Neuro deficit, acute, stroke suspected EXAM: MRI HEAD WITHOUT CONTRAST TECHNIQUE: Multiplanar, multiecho pulse sequences of the brain and surrounding structures were obtained without intravenous contrast. COMPARISON:  CT head Jul 04, 2023. FINDINGS: Brain: Punctate acute infarct in the right thalamus. No mass effect. No  midline shift. No evidence of acute hemorrhage, hydrocephalus or extra-axial fluid collection. Mild for age chronic microvascular ischemic change. Vascular: Major arterial flow voids are maintained at the skull base. Skull and upper cervical spine: Normal marrow signal. Sinuses/Orbits: Clear sinuses.  No acute orbital findings. Other: No mastoid effusions. IMPRESSION: Punctate acute infarct in the right thalamus. Electronically Signed   By: Stevenson Elbe M.D.   On: 07/05/2023 03:34   CT HEAD WO CONTRAST Result Date: 07/04/2023 CLINICAL DATA:  Altered mental status EXAM: CT HEAD WITHOUT CONTRAST TECHNIQUE: Contiguous axial images were obtained from the base of the skull through the vertex without intravenous contrast. RADIATION DOSE REDUCTION: This exam was performed according to the departmental dose-optimization program which includes automated exposure control, adjustment of the mA and/or kV according to patient size and/or use of iterative reconstruction technique. COMPARISON:  None Available. FINDINGS: Brain: No acute intracranial abnormality. Specifically, no hemorrhage, hydrocephalus, mass lesion, acute infarction, or significant intracranial injury. Vascular: No hyperdense vessel or unexpected calcification. Skull: No acute calvarial abnormality. Sinuses/Orbits: No acute findings Other: None IMPRESSION: No acute intracranial abnormality. Electronically Signed   By: Janeece Mechanic M.D.   On: 07/04/2023 20:10    Scheduled Meds:  [START ON 07/06/2023]  stroke: early stages of recovery book   Does not apply Once   [START ON 07/06/2023] aspirin  EC  81 mg Oral Daily   clopidogrel  75 mg Oral Daily   enoxaparin  (LOVENOX ) injection  40 mg Subcutaneous Q24H   montelukast  10 mg Oral Daily   pantoprazole   40 mg Oral Daily   simvastatin   40 mg Oral QHS   tiotropium  18 mcg Inhalation Daily   Continuous Infusions:   LOS: 0 days   Time spent: 45 minutes  Unk Garb, DO  [facility]  07/05/2023,  9:54 AM

## 2023-07-05 NOTE — Subjective & Objective (Signed)
 Pt seen and examined. Pt aware of her MRI brain report and small right thalamic CVA noted. Pt came into ER with left sided numbness of face, left UE and left LE. This is now all resolved except for some slight numbness of her left side of her lips. Pt able to eat breakfast this AM without dysphagia.  Awaiting bubble echo, PT/OT/ST and neurology consult.  Pt aware that she may be able to go home today.  Started on ASA and plavix. Awaiting lipid panel.

## 2023-07-05 NOTE — ED Provider Notes (Signed)
 [facility] Provider Note    Event Date/Time   First MD Initiated Contact with Patient 07/05/23 0006     (approximate)   History   Chief Complaint Numbness   HPI  Susan Jacobs is a 73 y.o. female with past medical history of hypertension, hyperlipidemia, asthma, COPD, and anemia who presents to the ED complaining of numbness.  Patient reports that just after midnight last night she noticed that the entire left side of her body felt numb and tingly, "like when you get an injection at the dentist."  She denies any associated discomfort or pain with the tingling, has not had any weakness or facial droop on this side.  She denies any changes in her vision or speech.  She is otherwise felt well with no fevers, cough, chest pain, shortness of breath, nausea, vomiting, diarrhea, or dysuria.  She does state symptoms have improved since onset but are still present.     Physical Exam   Triage Vital Signs: ED Triage Vitals  Encounter Vitals Group     BP 07/04/23 1921 133/80     Systolic BP Percentile --      Diastolic BP Percentile --      Pulse Rate 07/04/23 1921 81     Resp 07/04/23 1915 17     Temp 07/04/23 1918 98.6 F (37 C)     Temp Source 07/04/23 1918 Oral     SpO2 07/04/23 1921 100 %     Weight 07/04/23 1915 182 lb (82.6 kg)     Height 07/04/23 1915 5\' 4"  (1.626 m)     Head Circumference --      Peak Flow --      Pain Score 07/04/23 1915 0     Pain Loc --      Pain Education --      Exclude from Growth Chart --     Most recent vital signs: Vitals:   07/05/23 0036 07/05/23 0100  BP:  (!) 140/63  Pulse: 79 77  Resp:  18  Temp:    SpO2: 100% 99%    Constitutional: Alert and oriented. Eyes: Conjunctivae are normal. Head: Atraumatic. Nose: No congestion/rhinnorhea. Mouth/Throat: Mucous membranes are moist.  Cardiovascular: Normal rate, regular rhythm. Grossly normal heart sounds.  2+ radial pulses bilaterally. Respiratory: Normal  respiratory effort.  No retractions. Lungs CTAB. Gastrointestinal: Soft and nontender. No distention. Musculoskeletal: No lower extremity tenderness nor edema.  Neurologic:  Normal speech and language. No gross focal neurologic deficits are appreciated.    ED Results / Procedures / Treatments   Labs (all labs ordered are listed, but only abnormal results are displayed) Labs Reviewed  COMPREHENSIVE METABOLIC PANEL WITH GFR - Abnormal; Notable for the following components:      Result Value   BUN 29 (*)    Creatinine, Ser 1.67 (*)    Albumin 3.3 (*)    GFR, Estimated 33 (*)    All other components within normal limits  CBC WITH DIFFERENTIAL/PLATELET - Abnormal; Notable for the following components:   Hemoglobin 11.4 (*)    RDW 15.9 (*)    All other components within normal limits  PROTIME-INR  APTT  ETHANOL     EKG  ED ECG REPORT I, Twilla Galea, the attending physician, personally viewed and interpreted this ECG.   Date: 07/05/2023  EKG Time: 19:16  Rate: 82  Rhythm: normal sinus rhythm  Axis: Normal  Intervals:none  ST&T Change: None  RADIOLOGY CT head  reviewed and interpreted by me with no hemorrhage or midline shift.  PROCEDURES:  Critical Care performed: No  Procedures   MEDICATIONS ORDERED IN ED: Medications  aspirin  chewable tablet 324 mg (has no administration in time range)     IMPRESSION / MDM / ASSESSMENT AND PLAN / ED COURSE  I reviewed the triage vital signs and the nursing notes.                              73 y.o. female with past medical history of hypertension, hyperlipidemia, asthma, COPD, and anemia who presents to the ED complaining of left-sided numbness for the past 12 hours.  Patient's presentation is most consistent with acute presentation with potential threat to life or bodily function.  Differential diagnosis includes, but is not limited to, stroke, TIA, anemia, electrolyte abnormality, AKI, peripheral neuropathy,  paresthesia.  Patient nontoxic-appearing and in no acute distress, vital signs are unremarkable.  EKG shows no evidence of arrhythmia or ischemia, labs are reassuring with stable CKD but no acute anemia, leukocytosis, or electrolyte abnormality.  CT head is negative for acute process, will further assess with MRI to rule out stroke.  No symptoms to suggest spinal pathology.  MRI brain is concerning for punctate right thalamic stroke which would account for patient's symptoms.  We will give loading dose of aspirin  and case discussed with hospitalist for admission.      FINAL CLINICAL IMPRESSION(S) / ED DIAGNOSES   Final diagnoses:  Thalamic stroke (HCC)  Left sided numbness     Rx / DC Orders   ED Discharge Orders     None        Note:  This document was prepared using Dragon voice recognition software and may include unintentional dictation errors.   Twilla Galea, MD 07/05/23 [phone]

## 2023-07-05 NOTE — Assessment & Plan Note (Addendum)
 07-05-2023 hold HTN meds for now to allow for permissive HTN until pt can be seen by neurology.  07-06-2023 restart Cozaar 

## 2023-07-05 NOTE — Hospital Course (Signed)
 HPI: Susan Jacobs is a 73 y.o. African-American female with medical history significant for asthma, COPD, dyslipidemia and hypertension as well as GI bleeding, who presented to the emergency room with acute onset of left-sided numbness that started after midnight the night before last with associated tingling.  She denied any focal muscle weakness or facial droop.  No tinnitus or vertigo.  No witnessed seizures.  Notes dysphagia or dysarthria or visual changes.  No fever or chills.  No cough or wheezing or dyspnea.  No chest pain or palpitations.  She denied any nausea or vomiting or abdominal pain.  No bleeding diathesis.   Significant Events: Admitted 07/05/2023 for right thalamic CVA   Significant Labs: WBC 9.1, HGB 11.4, plt 325 Na 142, K 4.4, CO2 of 22, BUN 29, Scr 1.67, glu 95  Significant Imaging Studies: CT head No acute intracranial abnormality.  MRI brain Punctate acute infarct in the right thalamus  CTA head/neck  No emergent arterial finding. Atherosclerosis without flow reducing stenosis or ulceration of major arteries in the head and neck.  Antibiotic Therapy: Anti-infectives (From admission, onward)    None       Procedures:   Consultants: neurology

## 2023-07-05 NOTE — ED Notes (Signed)
 Pt given hygiene supplies and pt ambulatory to sink in room

## 2023-07-05 NOTE — Progress Notes (Signed)
 OT Cancellation Note  Patient Details Name: Susan Jacobs MRN: [id number] DOB: 05-16-1950   Cancelled Treatment:    Reason Eval/Treat Not Completed: OT screened, no needs identified, will sign off. Order received, chart reviewed. Per conversation with PT, pt back to baseline functional independence. No skilled OT needs identified. Will sign off. Please re-consult if additional needs arise.   Gordan Latina, M.S. OTR/L  07/05/23, 10:04 AM  ascom [phone]

## 2023-07-05 NOTE — Progress Notes (Signed)
 SLP Cancellation Note  Patient Details Name: Susan Jacobs MRN: [id number] DOB: 1950/12/05   Cancelled treatment:       Reason Eval/Treat Not Completed: SLP screened, no needs identified, will sign off (chart reviewed; met w/ pt.)  Pt denied any difficulty swallowing and is currently on a regular diet; tolerates swallowing pills w/ water per NSG. Pt consumed sips of liquids during this visit w/ SLP w/out difficulty noted. Pt stated she "ate my whole breakfast w/out a problem". Pt conversed in conversation w/out overt expressive/receptive deficits noted; pt denied any speech-language deficits. Speech clear, intelligible. She was talking on the phone w/ appropriate conversation/engagement noted.  No further skilled ST services indicated as pt appears at her communication baseline. Pt agreed. NSG to reconsult if any change in status while admitted.     Darla Edward, MS, CCC-SLP Speech Language Pathologist Rehab Services; [facility] [phone] (ascom) Zacory Fiola 07/05/2023, 11:00 AM

## 2023-07-05 NOTE — Assessment & Plan Note (Addendum)
 07-05-2023  Will continue his nebulized therapy with albuterol   07-06-2023 stable.

## 2023-07-05 NOTE — Assessment & Plan Note (Addendum)
 On admission. - This is associated with left-sided numbness. - The patient will be admitted to an observation medically monitored bed.   - We will follow neuro checks q.4 hours for 24 hours.   - The patient will be placed on aspirin .   - Will obtain a 2D echo with bubble study .   - A neurology consultation  as well as physical/occupation/speech therapy consults will be obtained in a.m.Aaron Aas - I notified Dr. Doretta Gant about the patient. - The patient will be placed on statin therapy and fasting lipids will be checked.  07-05-2023 Awaiting bubble echo, PT/OT/ST and neurology consult. Pt aware that she may be able to go home today. Started on ASA and plavix. Awaiting lipid panel.  07-06-2023 continue with 3 weeks of DAPT, followed by monotherapy with plavix 75 mg daily alone. Awaiting echo. No other skilled needs. On lipitor 80 mg daily.  Lipid Panel: Lab Results  Component Value Date/Time   CHOL 147 07/06/2023 04:14 AM   TRIG 139 07/06/2023 04:14 AM   HDL 52 07/06/2023 04:14 AM   CHOLHDL 2.8 07/06/2023 04:14 AM   VLDL 28 07/06/2023 04:14 AM   LDLCALC 67 07/06/2023 04:14 AM   Echo negative for shunt. LVEF 60%. Stable for DC today.

## 2023-07-06 ENCOUNTER — Observation Stay (HOSPITAL_BASED_OUTPATIENT_CLINIC_OR_DEPARTMENT_OTHER)
Admit: 2023-07-06 | Discharge: 2023-07-06 | Disposition: A | Discharge status: Home / Self Care | Attending: Family Medicine | Admitting: Family Medicine

## 2023-07-06 DIAGNOSIS — I6389 Other cerebral infarction: Secondary | ICD-10-CM | POA: Diagnosis not present

## 2023-07-06 DIAGNOSIS — I10 Essential (primary) hypertension: Secondary | ICD-10-CM | POA: Diagnosis not present

## 2023-07-06 DIAGNOSIS — I639 Cerebral infarction, unspecified: Secondary | ICD-10-CM | POA: Diagnosis not present

## 2023-07-06 DIAGNOSIS — J449 Chronic obstructive pulmonary disease, unspecified: Secondary | ICD-10-CM | POA: Diagnosis not present

## 2023-07-06 DIAGNOSIS — E785 Hyperlipidemia, unspecified: Secondary | ICD-10-CM | POA: Diagnosis not present

## 2023-07-06 DIAGNOSIS — I6381 Other cerebral infarction due to occlusion or stenosis of small artery: Secondary | ICD-10-CM | POA: Diagnosis not present

## 2023-07-06 LAB — LIPID PANEL
Cholesterol: 147 mg/dL (ref 0–200)
HDL: 52 mg/dL (ref >40)
LDL Cholesterol: 67 mg/dL (ref 0–99)
Total CHOL/HDL Ratio: 2.8 ratio
Triglycerides: 139 mg/dL (ref <150)
VLDL: 28 mg/dL (ref 0–40)

## 2023-07-06 LAB — ECHOCARDIOGRAM COMPLETE BUBBLE STUDY
AR max vel: 2.06 cm2
AV Area VTI: 2.31 cm2
AV Area mean vel: 1.96 cm2
AV Mean grad: 3 mmHg
AV Peak grad: 5 mmHg
Ao pk vel: 1.12 m/s
Area-P 1/2: 2.87 cm2
MV VTI: 2.44 cm2
S' Lateral: 2.8 cm

## 2023-07-06 LAB — HIV ANTIBODY (ROUTINE TESTING W REFLEX): HIV Screen 4th Generation wRfx: NONREACTIVE

## 2023-07-06 MED ORDER — LOSARTAN POTASSIUM 50 MG PO TABS
50.0000 mg | ORAL_TABLET | Freq: Every day | ORAL | Status: DC
  Administered 2023-07-06: 50 mg via ORAL
  Filled 2023-07-06: qty 1

## 2023-07-06 MED ORDER — CLOPIDOGREL BISULFATE 75 MG PO TABS: 75.0000 mg | ORAL_TABLET | Freq: Every day | ORAL | 0 refills | Status: AC

## 2023-07-06 MED ORDER — METHOCARBAMOL 750 MG PO TABS: 750.0000 mg | ORAL_TABLET | Freq: Four times a day (QID) | ORAL | 0 refills | Status: AC | PRN

## 2023-07-06 MED ORDER — ATORVASTATIN CALCIUM 80 MG PO TABS: 80.0000 mg | ORAL_TABLET | Freq: Every day | ORAL | 0 refills | Status: AC

## 2023-07-06 MED ORDER — ASPIRIN EC 81 MG PO TBEC: 81.0000 mg | DELAYED_RELEASE_TABLET | Freq: Every day | ORAL | 0 refills | Status: AC

## 2023-07-06 MED ORDER — METHOCARBAMOL 500 MG PO TABS
1000.0000 mg | ORAL_TABLET | Freq: Four times a day (QID) | ORAL | Status: DC | PRN
  Administered 2023-07-06: 1000 mg via ORAL
  Filled 2023-07-06: qty 2

## 2023-07-06 NOTE — Progress Notes (Signed)
*  PRELIMINARY RESULTS* Echocardiogram 2D Echocardiogram has been performed.  Susan Jacobs 07/06/2023, 7:55 AM

## 2023-07-06 NOTE — Care Management Obs Status (Signed)
 MEDICARE OBSERVATION STATUS NOTIFICATION   Patient Details  Name: Susan Jacobs MRN: [id number] Date of Birth: 10/09/1950   Medicare Observation Status Notification Given:  Rudolph Cost, CMA 07/06/2023, 9:41 AM

## 2023-07-06 NOTE — Consult Note (Signed)
 NEUROLOGY CONSULT NOTE   Date of service: Jul 06, 2023 Patient Name: Susan Jacobs MRN:  [id number] DOB:  [birth year] Chief Complaint: R thalamic CVA Requesting Provider: Unk Garb, DO  History of Present Illness   Susan Jacobs is a 73 y.o. female with hx of asthma, COPD, dyslipidemia, hypertension, prior GI bleed who presented to the emergency department overnight with acute onset of left-sided numbness that began at midnight the night before last.  The numbness was associated with tingling and paresthesias and initially it was throughout her whole left side however has now resolved except for a slight numbness to the left side of her lip.  Brain MRI revealed punctate acute infarct of the right thalamus.  NIH stroke scale was 1 for sensory deficit.  CTA head and neck showed no hemodynamically significant stenosis.  TTE is pending.   ROS   Comprehensive ROS performed and pertinent positives documented in HPI   Past History   Past Medical History:  Diagnosis Date   Anemia    Asthma    COPD (chronic obstructive pulmonary disease) (HCC)    GI bleed    Heart murmur    Hypercholesteremia    Hypertension     Past Surgical History:  Procedure Laterality Date   HERNIA REPAIR     TUBAL LIGATION      Family History: Family History  Problem Relation Age of Onset   Pancreatic cancer Mother    Throat cancer Father    Colon cancer Father    Esophageal cancer Sister    Breast cancer Neg Hx     Social History  reports that she quit smoking about 5 years ago. Her smoking use included cigarettes. She has never used smokeless tobacco. She reports current alcohol use of about 3.0 standard drinks of alcohol per week. She reports that she does not currently use drugs after having used the following drugs: Marijuana and "Crack" cocaine.  Allergies  Allergen Reactions   Aspirin  Other (See Comments) and Palpitations    Pt states that she is able to use the lower dose.   Lisinopril Other  (See Comments) and Hives    Other reaction(s): Unknown   Nsaids     Other reaction(s): Unknown    Medications   Current Facility-Administered Medications:     stroke: early stages of recovery book, , Does not apply, Once, Mansy, Jan A, MD   acetaminophen  (TYLENOL ) tablet 650 mg, 650 mg, Oral, Q6H PRN **OR** acetaminophen  (TYLENOL ) suppository 650 mg, 650 mg, Rectal, Q6H PRN, Mansy, Jan A, MD   albuterol  (PROVENTIL ) (2.5 MG/3ML) 0.083% nebulizer solution 2.5 mg, 2.5 mg, Nebulization, Q4H PRN, Mansy, Jan A, MD   aspirin  EC tablet 81 mg, 81 mg, Oral, Daily, Mansy, Jan A, MD   atorvastatin (LIPITOR) tablet 80 mg, 80 mg, Oral, Daily, Unk Garb, DO, 80 mg at 07/05/23 1610   clopidogrel (PLAVIX) tablet 75 mg, 75 mg, Oral, Daily, Mansy, Jan A, MD, 75 mg at 07/05/23 1051   enoxaparin  (LOVENOX ) injection 40 mg, 40 mg, Subcutaneous, Q24H, Mansy, Jan A, MD   magnesium  hydroxide (MILK OF MAGNESIA) suspension 30 mL, 30 mL, Oral, Daily PRN, Mansy, Jan A, MD   montelukast (SINGULAIR) tablet 10 mg, 10 mg, Oral, Daily, Mansy, Jan A, MD, 10 mg at 07/05/23 1051   ondansetron  (ZOFRAN ) tablet 4 mg, 4 mg, Oral, Q6H PRN **OR** ondansetron  (ZOFRAN ) injection 4 mg, 4 mg, Intravenous, Q6H PRN, Mansy, Jan A, MD   pantoprazole  (PROTONIX ) EC tablet  40 mg, 40 mg, Oral, Daily, Mansy, Jan A, MD, 40 mg at 07/05/23 1051   traZODone (DESYREL) tablet 25 mg, 25 mg, Oral, QHS PRN, Mansy, Jan A, MD  Vitals   Vitals:   07/05/23 1818 07/05/23 1944 07/06/23 0048 07/06/23 0438  BP: (!) 175/84 (!) 148/71 (!) 157/82 (!) 147/69  Pulse: 81 77 80 82  Resp: 19 14 18 18   Temp:  98.3 F (36.8 C) (!) 97.5 F (36.4 C) 98 F (36.7 C)  TempSrc:  Oral Oral Oral  SpO2: 100% 100% 97% 98%  Weight:      Height:        Body mass index is 31.24 kg/m.  Physical Exam   Gen: patient lying in bed, NAD CV: extremities appear well-perfused Resp: normal WOB  Neurologic Examination   MS: alert, oriented x4, follows commands Speech: no  dysarthria, no aphasia CN: PERRL, VFF, EOMI, impaired sensation on L, face symmetric, hearing intact to voice Motor: 5/5 strength throughout Sensory: SILT Reflexes: 2+ symm with toes down bilat Coordination: FNF intact bilat Gait: deferred  Labs/Imaging/Neurodiagnostic studies   CBC:  Recent Labs  Lab Jul 11, 2023 1923  WBC 9.1  NEUTROABS 6.0  HGB 11.4*  HCT 37.4  MCV 85.2  PLT 325   Basic Metabolic Panel:  Lab Results  Component Value Date   NA 142 Jul 11, 2023   K 4.4 Jul 11, 2023   CO2 22 07-11-2023   GLUCOSE 95 07/11/23   BUN 29 (H) 2023-07-11   CREATININE 1.67 (H) Jul 11, 2023   CALCIUM 9.1 07/11/2023   GFRNONAA 33 (L) 07/11/2023   GFRAA 39 (L) 01/29/2019   Lipid Panel:  Lab Results  Component Value Date   LDLCALC 67 07/06/2023   HgbA1c:  Lab Results  Component Value Date   HGBA1C 6.1 (H) Jul 11, 2023    Alcohol Level     Component Value Date/Time   [facility] <15 07-11-23 1923   INR  Lab Results  Component Value Date   INR 0.9 Jul 11, 2023   APTT  Lab Results  Component Value Date   APTT 30 Jul 11, 2023   AED levels: No results found for: "PHENYTOIN", "ZONISAMIDE", "LAMOTRIGINE", "LEVETIRACETA"  CT angio Head and Neck with contrast(Personally reviewed): No hemodynamically significant stenosis  MRI Brain(Personally reviewed): Punctate acute infarct right thalamus  ASSESSMENT   Susan Jacobs is a 73 y.o. female with hx of asthma, COPD, dyslipidemia, hypertension, prior GI bleed who presented with left-sided numbness and was found to have a punctate acute infarct in the right thalamus.  Her symptoms have significantly improved and now she only has numbness on a small area to the left side of her mouth.  Etiology of stroke is favored to be small vessel disease.  RECOMMENDATIONS   - No indication for permissive HTN >48 hrs out from sx onset - TTE pending - ASA 81mg  daily + plavix 75mg  daily x21 days f/b plavix 75mg  daily monotherapy after that - Continue  atorvastatin 80mg  daily - F/u TTE  If TTE is unremarkable with no intracardiac clot or emergent findings okay to discharge patient on the above regimen with outpatient follow-up with neurology. If TTE abnormal please contact neurology for further recommendations.  ______________________________________________________________________    Signed, Eleni Griffin, MD [facility]

## 2023-07-06 NOTE — Progress Notes (Signed)
 PROGRESS NOTE    KEALOHA HURTGEN  WUJ:[id number] DOB: 1950-12-22 DOA: 07/05/2023 [facility]CP: [facility]  Subjective: Pt seen and examined.  Pt seen by neurology. They have signed off. On ASA, plavix. Had echo performed this AM. Awaiting final report. Pt has been seen by PT/OT. No needs.   Hospital Course: HPI: Susan Jacobs is a 73 y.o. African-American female with medical history significant for asthma, COPD, dyslipidemia and hypertension as well as GI bleeding, who presented to the emergency room with acute onset of left-sided numbness that started after midnight the night before last with associated tingling.  She denied any focal muscle weakness or facial droop.  No tinnitus or vertigo.  No witnessed seizures.  Notes dysphagia or dysarthria or visual changes.  No fever or chills.  No cough or wheezing or dyspnea.  No chest pain or palpitations.  She denied any nausea or vomiting or abdominal pain.  No bleeding diathesis.   Significant Events: Admitted 07/05/2023 for right thalamic CVA   Significant Labs: WBC 9.1, HGB 11.4, plt 325 Na 142, K 4.4, CO2 of 22, BUN 29, Scr 1.67, glu 95  Significant Imaging Studies: CT head No acute intracranial abnormality.  MRI brain Punctate acute infarct in the right thalamus  CTA head/neck  No emergent arterial finding. Atherosclerosis without flow reducing stenosis or ulceration of major arteries in the head and neck.  Antibiotic Therapy: Anti-infectives (From admission, onward)    None       Procedures:   Consultants: neurology    Assessment and Plan: * Right thalamic infarction [facility]) On admission. - This is associated with left-sided numbness. - The patient will be admitted to an observation medically monitored bed.   - We will follow neuro checks q.4 hours for 24 hours.   - The patient will be placed on aspirin .   - Will obtain a 2D echo with bubble study .   - A neurology consultation  as well as  physical/occupation/speech therapy consults will be obtained in a.m.Aaron Aas - I notified Dr. Doretta Gant about the patient. - The patient will be placed on statin therapy and fasting lipids will be checked.  07-05-2023 Awaiting bubble echo, PT/OT/ST and neurology consult. Pt aware that she may be able to go home today. Started on ASA and plavix. Awaiting lipid panel.  07-06-2023 continue with 3 weeks of DAPT, followed by monotherapy with plavix 75 mg daily alone. Awaiting echo. No other skilled needs. On lipitor 80 mg daily.  Lipid Panel: Lab Results  Component Value Date/Time   CHOL 147 07/06/2023 04:14 AM   TRIG 139 07/06/2023 04:14 AM   HDL 52 07/06/2023 04:14 AM   CHOLHDL 2.8 07/06/2023 04:14 AM   VLDL 28 07/06/2023 04:14 AM   LDLCALC 67 07/06/2023 04:14 AM      Essential hypertension 07-05-2023 hold HTN meds for now to allow for permissive HTN until pt can be seen by neurology.  07-06-2023 restart Cozaar   Obesity, Class I, BMI 30-34.9 Body mass index is 31.24 kg/m.   Chronic obstructive pulmonary disease (COPD) (HCC) 07-05-2023  Will continue his nebulized therapy with albuterol   07-06-2023 stable.   GERD without esophagitis 07-05-2023  Will continue PPI therapy.  07-06-2023 stable.  Dyslipidemia 07-05-2023 Will continue statin therapy.  07-06-2023 continue lipitor 80 mg daily.  DVT prophylaxis: enoxaparin  (LOVENOX ) injection 40 mg Start: 07/05/23 2200 SCDs Start: 07/05/23 9562     Code Status: Full Code Family Communication: no family at bedside. Pt is decisional.  Disposition Plan: return home Reason for continuing need for hospitalization: medically stable. Awaiting echo report. Probable DC today.  Objective: Vitals:   07/05/23 1944 07/06/23 0048 07/06/23 0438 07/06/23 0803  BP: (!) 148/71 (!) 157/82 (!) 147/69 (!) 161/77  Pulse: 77 80 82 78  Resp: 14 18 18 20   Temp: 98.3 F (36.8 C) (!) 97.5 F (36.4 C) 98 F (36.7 C) 97.7 F (36.5 C)  TempSrc: Oral Oral  Oral Oral  SpO2: 100% 97% 98% 100%  Weight:      Height:        Intake/Output Summary (Last 24 hours) at 07/06/2023 1122 Last data filed at 07/06/2023 0438 Gross per 24 hour  Intake 480 ml  Output --  Net 480 ml   Filed Weights   07/04/23 1915  Weight: 82.6 kg    Examination:  Physical Exam Vitals and nursing note reviewed.  Constitutional:      General: She is not in acute distress.    Appearance: She is obese. She is not toxic-appearing or diaphoretic.  HENT:     Head: Normocephalic and atraumatic.     Nose: Nose normal.  Cardiovascular:     Rate and Rhythm: Normal rate and regular rhythm.  Pulmonary:     Effort: Pulmonary effort is normal.     Breath sounds: Normal breath sounds.  Abdominal:     General: Bowel sounds are normal.     Palpations: Abdomen is soft.  Musculoskeletal:     Right lower leg: No edema.     Left lower leg: No edema.  Skin:    General: Skin is warm and dry.     Capillary Refill: Capillary refill takes less than 2 seconds.  Neurological:     General: No focal deficit present.     Mental Status: She is alert and oriented to person, place, and time.     Data Reviewed: I have personally reviewed following labs and imaging studies  CBC: Recent Labs  Lab 07/04/23 1923  WBC 9.1  NEUTROABS 6.0  HGB 11.4*  HCT 37.4  MCV 85.2  PLT 325   Basic Metabolic Panel: Recent Labs  Lab 07/04/23 1923  NA 142  K 4.4  CL 108  CO2 22  GLUCOSE 95  BUN 29*  CREATININE 1.67*  CALCIUM 9.1   GFR: Estimated Creatinine Clearance: 32.6 mL/min (A) (by C-G formula based on SCr of 1.67 mg/dL (H)). Liver Function Tests: Recent Labs  Lab 07/04/23 1923  AST 24  ALT 12  ALKPHOS 87  BILITOT 1.0  PROT 7.5  ALBUMIN 3.3*   Coagulation Profile: Recent Labs  Lab 07/04/23 1923  INR 0.9   HbA1C: Recent Labs    07/04/23 1923  HGBA1C 6.1*   Lipid Profile: Recent Labs    07/06/23 0414  CHOL 147  HDL 52  LDLCALC 67  TRIG 139  CHOLHDL 2.8    Radiology Studies: CT ANGIO HEAD NECK W WO CM Result Date: 07/05/2023 CLINICAL DATA:  Stroke, determine embolic source. EXAM: CT ANGIOGRAPHY HEAD AND NECK WITH AND WITHOUT CONTRAST TECHNIQUE: Multidetector CT imaging of the head and neck was performed using the standard protocol during bolus administration of intravenous contrast. Multiplanar CT image reconstructions and MIPs were obtained to evaluate the vascular anatomy. Carotid stenosis measurements (when applicable) are obtained utilizing NASCET criteria, using the distal internal carotid diameter as the denominator. RADIATION DOSE REDUCTION: This exam was performed according to the departmental dose-optimization program which includes automated exposure control, adjustment of  the mA and/or kV according to patient size and/or use of iterative reconstruction technique. CONTRAST:  50mL OMNIPAQUE  IOHEXOL  350 MG/ML SOLN COMPARISON:  Brain MRI from earlier today FINDINGS: CT HEAD FINDINGS Brain: The patient's acute infarcts by MRI is occult by CT. No hemorrhage, hydrocephalus, or masslike finding Vascular: No hyperdense vessel or unexpected calcification. Skull: Normal. Negative for fracture or focal lesion. Sinuses/Orbits: No acute finding. Review of the MIP images confirms the above findings CTA NECK FINDINGS Aortic arch: Atheromatous plaque that is extensive. Two vessel branching Right carotid system: Diffuse atheromatous wall thickening with prominent low-density plaque at the bifurcation. No focal flow reducing stenosis, ulceration, or beading. Mild vessel blurring at the proximal ICA where there is some motion. Left carotid system: Diffuse atheromatous wall thickening with accentuated mixed density plaque at the bifurcation. No flow reducing stenosis, ulceration, or beading Vertebral arteries: Proximal subclavian atherosclerosis greater on the left. The vertebral arteries are smoothly contoured and widely patent to the dura. Skeleton: No acute finding  Other neck: No acute or aggressive finding. Small left occipital lipoma. Upper chest: No acute finding Review of the MIP images confirms the above findings CTA HEAD FINDINGS Anterior circulation: Atheromatous calcification of the cervical carotids. No branch occlusion, beading, or aneurysm. Posterior circulation: Fetal type right PCA. The vertebral and basilar arteries are diffusely patent. No branch occlusion, beading, or aneurysm. Venous sinuses: Unremarkable Anatomic variants: None significant Review of the MIP images confirms the above findings IMPRESSION: No emergent arterial finding. Atherosclerosis without flow reducing stenosis or ulceration of major arteries in the head and neck. Electronically Signed   By: Ronnette Coke M.D.   On: 07/05/2023 07:24   MR BRAIN WO CONTRAST Result Date: 07/05/2023 CLINICAL DATA:  Neuro deficit, acute, stroke suspected EXAM: MRI HEAD WITHOUT CONTRAST TECHNIQUE: Multiplanar, multiecho pulse sequences of the brain and surrounding structures were obtained without intravenous contrast. COMPARISON:  CT head Jul 04, 2023. FINDINGS: Brain: Punctate acute infarct in the right thalamus. No mass effect. No midline shift. No evidence of acute hemorrhage, hydrocephalus or extra-axial fluid collection. Mild for age chronic microvascular ischemic change. Vascular: Major arterial flow voids are maintained at the skull base. Skull and upper cervical spine: Normal marrow signal. Sinuses/Orbits: Clear sinuses.  No acute orbital findings. Other: No mastoid effusions. IMPRESSION: Punctate acute infarct in the right thalamus. Electronically Signed   By: Stevenson Elbe M.D.   On: 07/05/2023 03:34   CT HEAD WO CONTRAST Result Date: 07/04/2023 CLINICAL DATA:  Altered mental status EXAM: CT HEAD WITHOUT CONTRAST TECHNIQUE: Contiguous axial images were obtained from the base of the skull through the vertex without intravenous contrast. RADIATION DOSE REDUCTION: This exam was performed according  to the departmental dose-optimization program which includes automated exposure control, adjustment of the mA and/or kV according to patient size and/or use of iterative reconstruction technique. COMPARISON:  None Available. FINDINGS: Brain: No acute intracranial abnormality. Specifically, no hemorrhage, hydrocephalus, mass lesion, acute infarction, or significant intracranial injury. Vascular: No hyperdense vessel or unexpected calcification. Skull: No acute calvarial abnormality. Sinuses/Orbits: No acute findings Other: None IMPRESSION: No acute intracranial abnormality. Electronically Signed   By: Janeece Mechanic M.D.   On: 07/04/2023 20:10    Scheduled Meds:  aspirin  EC  81 mg Oral Daily   atorvastatin  80 mg Oral Daily   clopidogrel  75 mg Oral Daily   enoxaparin  (LOVENOX ) injection  40 mg Subcutaneous Q24H   losartan   50 mg Oral Daily   montelukast  10 mg  Oral Daily   pantoprazole   40 mg Oral Daily   Continuous Infusions:   LOS: 0 days   Time spent: 45 minutes  Unk Garb, DO  [facility]  07/06/2023, 11:22 AM

## 2023-07-06 NOTE — TOC Initial Note (Signed)
 Transition of Care [facility]) - Initial/Assessment Note    Patient Details  Name: Susan Jacobs MRN: [id number] Date of Birth: 15-Feb-1951  [facility]) CM/SW Contact:    Elsie Halo, RN Phone Number: 07/06/2023, 4:16 PM  Clinical Narrative:                  Patient is from home with no needs. Please outreach to [facility] if needs identified.       Patient Goals and CMS Choice            Expected Discharge Plan and Services         Expected Discharge Date: 07/06/23                                    Prior Living Arrangements/Services                       Activities of Daily Living   ADL Screening (condition at time of admission) Independently performs ADLs?: Yes (appropriate for developmental age) Is the patient deaf or have difficulty hearing?: No Does the patient have difficulty seeing, even when wearing glasses/contacts?: No Does the patient have difficulty concentrating, remembering, or making decisions?: No  Permission Sought/Granted                  Emotional Assessment              Admission diagnosis:  Left sided numbness [R20.0] Thalamic stroke (HCC) [I63.81] Right thalamic infarction [facility]) [I63.81] Patient Active Problem List   Diagnosis Date Noted   Right thalamic infarction (HCC) 07/05/2023   GERD without esophagitis 07/05/2023   Chronic obstructive pulmonary disease (COPD) (HCC) 07/05/2023   Obesity, Class I, BMI 30-34.9 07/05/2023   Dyslipidemia 08/19/2015   Tobacco abuse 08/19/2015   Essential hypertension 08/19/2015   PCP:  [facility] Pharmacy:   [facility] - [place], [place] - [address] [place] [address] Phone: [phone] Fax: [phone]  [facility] [address]), [place] - [address] [place] [place]) [place] [address] Phone: [phone] Fax:  [phone]     Social Drivers of Health (SDOH) Social History: SDOH Screenings   Food Insecurity: No Food Insecurity (07/05/2023)  Housing: Low Risk  (07/05/2023)  Transportation Needs: No Transportation Needs (07/05/2023)  Utilities: Not At Risk (07/05/2023)  Financial Resource Strain: Medium Risk (03/11/2018)  Social Connections: Socially Isolated (07/05/2023)  Tobacco Use: Medium Risk (07/04/2023)   SDOH Interventions:     Readmission Risk Interventions     No data to display

## 2023-07-06 NOTE — Plan of Care (Signed)

## 2023-07-06 NOTE — Plan of Care (Signed)
  Problem: Ischemic Stroke/TIA Tissue Perfusion: Goal: Complications of ischemic stroke/TIA will be minimized Outcome: Progressing   Problem: Education: Goal: Knowledge of disease or condition will improve Outcome: Progressing Goal: Knowledge of secondary prevention will improve: Hypertension and Hyperlipidemia Outcome: Progressing  Problem: Coping: Goal: Will verbalize positive feelings about self Outcome: Progressing Goal: Will identify appropriate support needs Outcome: Progressing   Problem: Health Behavior/Discharge Planning: Goal: Ability to manage health-related needs will improve Outcome: Progressing Goal: Goals will be collaboratively established with patient/family Outcome: Progressing   Problem: Self-Care: Goal: Ability to participate in self-care as condition permits will improve Outcome: Progressing Goal: Verbalization of feelings and concerns over difficulty with self-care will improve Outcome: Progressing Goal: Ability to communicate needs accurately will improve Outcome: Progressing   Problem: Nutrition: Goal: Risk of aspiration will decrease Outcome: Progressing Goal: Dietary intake will improve Outcome: Progressing

## 2023-07-06 NOTE — Discharge Summary (Signed)
 [facility] Physician Discharge Summary   Patient name: Susan Jacobs  Admit date:     07/05/2023  Discharge date: 07/06/2023  Attending Physician: Virgene Griffin [[id number]]  Discharge Physician: Unk Garb   PCP: [facility]  Admitted From: Home  Disposition:  Home  Recommendations for Outpatient Follow-up:  Follow up with PCP in 1-2 weeks Follow up with Stroke clinic in 2 weeks  Home Health:No Equipment/Devices: None    Discharge Condition:Stable CODE STATUS:FULL Diet recommendation: Heart Healthy Fluid Restriction: None  Hospital Summary: HPI: Susan Jacobs is a 73 y.o. African-American female with medical history significant for asthma, COPD, dyslipidemia and hypertension as well as GI bleeding, who presented to the emergency room with acute onset of left-sided numbness that started after midnight the night before last with associated tingling.  She denied any focal muscle weakness or facial droop.  No tinnitus or vertigo.  No witnessed seizures.  Notes dysphagia or dysarthria or visual changes.  No fever or chills.  No cough or wheezing or dyspnea.  No chest pain or palpitations.  She denied any nausea or vomiting or abdominal pain.  No bleeding diathesis.   Significant Events: Admitted 07/05/2023 for right thalamic CVA   Significant Labs: WBC 9.1, HGB 11.4, plt 325 Na 142, K 4.4, CO2 of 22, BUN 29, Scr 1.67, glu 95  Significant Imaging Studies: CT head No acute intracranial abnormality.  MRI brain Punctate acute infarct in the right thalamus  CTA head/neck  No emergent arterial finding. Atherosclerosis without flow reducing stenosis or ulceration of major arteries in the head and neck.  Antibiotic Therapy: Anti-infectives (From admission, onward)    None       Procedures:   Consultants: neurology   Hospital Course by Problem: * Right thalamic infarction (HCC) On admission. - This is associated with left-sided numbness. -  The patient will be admitted to an observation medically monitored bed.   - We will follow neuro checks q.4 hours for 24 hours.   - The patient will be placed on aspirin .   - Will obtain a 2D echo with bubble study .   - A neurology consultation  as well as physical/occupation/speech therapy consults will be obtained in a.m.Aaron Aas - I notified Dr. Doretta Gant about the patient. - The patient will be placed on statin therapy and fasting lipids will be checked.  07-05-2023 Awaiting bubble echo, PT/OT/ST and neurology consult. Pt aware that she may be able to go home today. Started on ASA and plavix. Awaiting lipid panel.  07-06-2023 continue with 3 weeks of DAPT, followed by monotherapy with plavix 75 mg daily alone. Awaiting echo. No other skilled needs. On lipitor 80 mg daily.  Lipid Panel: Lab Results  Component Value Date/Time   CHOL 147 07/06/2023 04:14 AM   TRIG 139 07/06/2023 04:14 AM   HDL 52 07/06/2023 04:14 AM   CHOLHDL 2.8 07/06/2023 04:14 AM   VLDL 28 07/06/2023 04:14 AM   LDLCALC 67 07/06/2023 04:14 AM   Echo negative for shunt. LVEF 60%. Stable for DC today.   Essential hypertension 07-05-2023 hold HTN meds for now to allow for permissive HTN until pt can be seen by neurology.  07-06-2023 restart Cozaar   Obesity, Class I, BMI 30-34.9 Body mass index is 31.24 kg/m.   Chronic obstructive pulmonary disease (COPD) (HCC) 07-05-2023  Will continue his nebulized therapy with albuterol   07-06-2023 stable.   GERD without esophagitis 07-05-2023  Will continue PPI therapy.  07-06-2023 stable.  Dyslipidemia 07-05-2023 Will continue statin therapy.  07-06-2023 continue lipitor 80 mg daily.    Discharge Diagnoses:  Principal Problem:   Right thalamic infarction [facility]) Active Problems:   Essential hypertension   Dyslipidemia   GERD without esophagitis   Chronic obstructive pulmonary disease (COPD) (HCC)   Obesity, Class I, BMI 30-34.9   Discharge  Instructions  Discharge Instructions     Ambulatory referral to Neurology   Complete by: As directed    An appointment is requested in approximately: 2 weeks. Indication: CVA   Call MD for:  difficulty breathing, headache or visual disturbances   Complete by: As directed    Call MD for:  hives   Complete by: As directed    Call MD for:  persistant dizziness or light-headedness   Complete by: As directed    Call MD for:  persistant nausea and vomiting   Complete by: As directed    Call MD for:  redness, tenderness, or signs of infection (pain, swelling, redness, odor or green/yellow discharge around incision site)   Complete by: As directed    Call MD for:  severe uncontrolled pain   Complete by: As directed    Call MD for:  temperature >100.4   Complete by: As directed    Diet - low sodium heart healthy   Complete by: As directed    Discharge instructions   Complete by: As directed    1. Follow up with your primary care provider in 1-2 weeks following discharge from hospital. 2. Stroke clinic will call you with a follow up appointment   Increase activity slowly   Complete by: As directed       Allergies as of 07/06/2023       Reactions   Aspirin  Other (See Comments), Palpitations   Pt states that she is able to use the lower dose.   Lisinopril Other (See Comments), Hives   Other reaction(s): Unknown   Nsaids    Other reaction(s): Unknown        Medication List     TAKE these medications    acetaminophen  500 MG tablet Commonly known as: TYLENOL  Take 1,000 mg by mouth every 6 (six) hours as needed for mild pain (pain score 1-3), fever or headache.   albuterol  108 (90 Base) MCG/ACT inhaler Commonly known as: Proventil  HFA Inhale 2 puffs into the lungs every 4 (four) hours as needed for wheezing or shortness of breath.   albuterol  (2.5 MG/3ML) 0.083% nebulizer solution Commonly known as: PROVENTIL  Take 3 mLs (2.5 mg total) by nebulization every 4 (four) hours as  needed for wheezing or shortness of breath.   aspirin  EC 81 MG tablet Take 1 tablet (81 mg total) by mouth daily for 21 days.   atorvastatin 80 MG tablet Commonly known as: LIPITOR Take 1 tablet (80 mg total) by mouth daily.   clopidogrel 75 MG tablet Commonly known as: PLAVIX Take 1 tablet (75 mg total) by mouth daily. Start taking on: Jul 07, 2023   Compressor/Nebulizer Misc 1 Units by Does not apply route every 4 (four) hours as needed.   CoQ10 100 MG Caps Take 100 mg by mouth daily.   Fluticasone-Salmeterol 250-50 MCG/DOSE Aepb Commonly known as: ADVAIR   hydrochlorothiazide  12.5 MG capsule Commonly known as: MICROZIDE  Take 12.5 mg by mouth daily.   losartan  50 MG tablet Commonly known as: COZAAR  Take 50 mg by mouth daily.   methocarbamol 750 MG tablet Commonly known as: ROBAXIN Take 1 tablet (750  mg total) by mouth every 6 (six) hours as needed for muscle spasms.   montelukast 10 MG tablet Commonly known as: SINGULAIR Take 10 mg by mouth daily.   omeprazole  20 MG capsule Commonly known as: PRILOSEC  Take 20 mg by mouth daily.        Allergies  Allergen Reactions   Aspirin  Other (See Comments) and Palpitations    Pt states that she is able to use the lower dose.   Lisinopril Other (See Comments) and Hives    Other reaction(s): Unknown   Nsaids     Other reaction(s): Unknown    Discharge Exam: Vitals:   07/06/23 0803 07/06/23 1608  BP: (!) 161/77 121/68  Pulse: 78 81  Resp: 20 18  Temp: 97.7 F (36.5 C) 98.6 F (37 C)  SpO2: 100% 100%    Physical Exam Vitals and nursing note reviewed.  Constitutional:      General: She is not in acute distress.    Appearance: She is obese. She is not toxic-appearing or diaphoretic.  HENT:     Head: Normocephalic and atraumatic.     Nose: Nose normal.  Cardiovascular:     Rate and Rhythm: Normal rate and regular rhythm.  Pulmonary:     Effort: Pulmonary effort is normal.     Breath sounds: Normal breath  sounds.  Abdominal:     General: Bowel sounds are normal.     Palpations: Abdomen is soft.  Musculoskeletal:     Right lower leg: No edema.     Left lower leg: No edema.  Skin:    General: Skin is warm and dry.     Capillary Refill: Capillary refill takes less than 2 seconds.  Neurological:     General: No focal deficit present.     Mental Status: She is alert and oriented to person, place, and time.     The results of significant diagnostics from this hospitalization (including imaging, microbiology, ancillary and laboratory) are listed below for reference.     Labs:  Basic Metabolic Panel: Recent Labs  Lab 07/04/23 1923  NA 142  K 4.4  CL 108  CO2 22  GLUCOSE 95  BUN 29*  CREATININE 1.67*  CALCIUM 9.1   Liver Function Tests: Recent Labs  Lab 07/04/23 1923  AST 24  ALT 12  ALKPHOS 87  BILITOT 1.0  PROT 7.5  ALBUMIN 3.3*   CBC: Recent Labs  Lab 07/04/23 1923  WBC 9.1  NEUTROABS 6.0  HGB 11.4*  HCT 37.4  MCV 85.2  PLT 325   Hgb A1c Recent Labs    07/04/23 1923  HGBA1C 6.1*   Lipid Profile Recent Labs    07/06/23 0414  CHOL 147  HDL 52  LDLCALC 67  TRIG 139  CHOLHDL 2.8   Sepsis Labs Recent Labs  Lab 07/04/23 1923  WBC 9.1    Procedures/Studies: ECHOCARDIOGRAM COMPLETE BUBBLE STUDY Result Date: 07/06/2023    ECHOCARDIOGRAM REPORT   Patient Name:   Susan Jacobs Date of Exam: 07/06/2023 Medical Rec #:  [id number]      Height:       64.0 in Accession #:    [id number]     Weight:       182.0 lb Date of Birth:  June 23, 1950      BSA:          1.880 m Patient Age:    72 years       BP:  147/69 mmHg Patient Gender: F              HR:           82 bpm. Exam Location:  [facility] Procedure: 2D Echo, Cardiac Doppler, Color Doppler and Saline Contrast Bubble            Study (Both Spectral and Color Flow Doppler were utilized during            procedure). Indications:     Stroke 434.91 / I63.9  History:         Patient has no prior history of  Echocardiogram examinations.                  COPD; Risk Factors:Hypertension.  Sonographer:     Broadus Canes Referring Phys:  [id number] JAN A MANSY Diagnosing Phys: Belva Boyden MD  Sonographer Comments: Image acquisition challenging due to COPD. IMPRESSIONS  1. Left ventricular ejection fraction, by estimation, is 60 to 65%. The left ventricle has normal function. The left ventricle has no regional wall motion abnormalities. Left ventricular diastolic parameters are consistent with Grade I diastolic dysfunction (impaired relaxation).  2. Right ventricular systolic function is normal. The right ventricular size is normal. There is normal pulmonary artery systolic pressure. The estimated right ventricular systolic pressure is 19.1 mmHg.  3. The mitral valve is normal in structure. Mild mitral valve regurgitation. No evidence of mitral stenosis.  4. The aortic valve is normal in structure. Aortic valve regurgitation is not visualized. No aortic stenosis is present.  5. The inferior vena cava is normal in size with greater than 50% respiratory variability, suggesting right atrial pressure of 3 mmHg.  6. Agitated saline contrast bubble study was negative, with no evidence of any interatrial shunt. FINDINGS  Left Ventricle: Left ventricular ejection fraction, by estimation, is 60 to 65%. The left ventricle has normal function. The left ventricle has no regional wall motion abnormalities. Strain was performed and the global longitudinal strain is indeterminate. The left ventricular internal cavity size was normal in size. There is no left ventricular hypertrophy. Left ventricular diastolic parameters are consistent with Grade I diastolic dysfunction (impaired relaxation). Right Ventricle: The right ventricular size is normal. No increase in right ventricular wall thickness. Right ventricular systolic function is normal. There is normal pulmonary artery systolic pressure. The tricuspid regurgitant velocity is 1.88 m/s, and   with an assumed right atrial pressure of 5 mmHg, the estimated right ventricular systolic pressure is 19.1 mmHg. Left Atrium: Left atrial size was normal in size. Right Atrium: Right atrial size was normal in size. Pericardium: There is no evidence of pericardial effusion. Mitral Valve: The mitral valve is normal in structure. Mild mitral valve regurgitation. No evidence of mitral valve stenosis. MV peak gradient, 4.2 mmHg. The mean mitral valve gradient is 2.0 mmHg. Tricuspid Valve: The tricuspid valve is normal in structure. Tricuspid valve regurgitation is not demonstrated. No evidence of tricuspid stenosis. Aortic Valve: The aortic valve is normal in structure. Aortic valve regurgitation is not visualized. No aortic stenosis is present. Aortic valve mean gradient measures 3.0 mmHg. Aortic valve peak gradient measures 5.0 mmHg. Aortic valve area, by VTI measures 2.31 cm. Pulmonic Valve: The pulmonic valve was normal in structure. Pulmonic valve regurgitation is not visualized. No evidence of pulmonic stenosis. Aorta: The aortic root is normal in size and structure. Venous: The inferior vena cava is normal in size with greater than 50% respiratory variability, suggesting right atrial pressure of 3 mmHg.  IAS/Shunts: No atrial level shunt detected by color flow Doppler. Agitated saline contrast was given intravenously to evaluate for intracardiac shunting. Agitated saline contrast bubble study was negative, with no evidence of any interatrial shunt. There  is no evidence of a patent foramen ovale. There is no evidence of an atrial septal defect. Additional Comments: 3D was performed not requiring image post processing on an independent workstation and was indeterminate.  LEFT VENTRICLE PLAX 2D LVIDd:         4.60 cm   Diastology LVIDs:         2.80 cm   LV e' medial:    7.72 cm/s LV PW:         1.00 cm   LV E/e' medial:  9.4 LV IVS:        0.90 cm   LV e' lateral:   7.94 cm/s LVOT diam:     1.90 cm   LV E/e'  lateral: 9.2 LV SV:         54 LV SV Index:   29 LVOT Area:     2.84 cm  RIGHT VENTRICLE RV Basal diam:  4.10 cm RV Mid diam:    2.90 cm LEFT ATRIUM             Index        RIGHT ATRIUM           Index LA diam:        2.00 cm 1.06 cm/m   RA Area:     16.40 cm LA Vol (A2C):   34.9 ml 18.57 ml/m  RA Volume:   42.70 ml  22.72 ml/m LA Vol (A4C):   26.5 ml 14.10 ml/m LA Biplane Vol: 30.7 ml 16.33 ml/m  AORTIC VALVE AV Area (Vmax):    2.06 cm AV Area (Vmean):   1.96 cm AV Area (VTI):     2.31 cm AV Vmax:           112.00 cm/s AV Vmean:          76.000 cm/s AV VTI:            0.234 m AV Peak Grad:      5.0 mmHg AV Mean Grad:      3.0 mmHg LVOT Vmax:         81.40 cm/s LVOT Vmean:        52.600 cm/s LVOT VTI:          0.191 m LVOT/AV VTI ratio: 0.82  AORTA Ao Root diam: 2.10 cm MITRAL VALVE                TRICUSPID VALVE MV Area (PHT): 2.87 cm     TR Peak grad:   14.1 mmHg MV Area VTI:   2.44 cm     TR Vmax:        188.00 cm/s MV Peak grad:  4.2 mmHg MV Mean grad:  2.0 mmHg     SHUNTS MV Vmax:       1.02 m/s     Systemic VTI:  0.19 m MV Vmean:      60.0 cm/s    Systemic Diam: 1.90 cm MV Decel Time: 264 msec MV E velocity: 72.80 cm/s MV A velocity: 112.00 cm/s MV E/A ratio:  0.65 Belva Boyden MD Electronically signed by Belva Boyden MD Signature Date/Time: 07/06/2023/4:13:13 PM    Final    CT ANGIO HEAD NECK W WO CM Result Date: 07/05/2023 CLINICAL DATA:  Stroke, determine  embolic source. EXAM: CT ANGIOGRAPHY HEAD AND NECK WITH AND WITHOUT CONTRAST TECHNIQUE: Multidetector CT imaging of the head and neck was performed using the standard protocol during bolus administration of intravenous contrast. Multiplanar CT image reconstructions and MIPs were obtained to evaluate the vascular anatomy. Carotid stenosis measurements (when applicable) are obtained utilizing NASCET criteria, using the distal internal carotid diameter as the denominator. RADIATION DOSE REDUCTION: This exam was performed according to the  departmental dose-optimization program which includes automated exposure control, adjustment of the mA and/or kV according to patient size and/or use of iterative reconstruction technique. CONTRAST:  50mL OMNIPAQUE  IOHEXOL  350 MG/ML SOLN COMPARISON:  Brain MRI from earlier today FINDINGS: CT HEAD FINDINGS Brain: The patient's acute infarcts by MRI is occult by CT. No hemorrhage, hydrocephalus, or masslike finding Vascular: No hyperdense vessel or unexpected calcification. Skull: Normal. Negative for fracture or focal lesion. Sinuses/Orbits: No acute finding. Review of the MIP images confirms the above findings CTA NECK FINDINGS Aortic arch: Atheromatous plaque that is extensive. Two vessel branching Right carotid system: Diffuse atheromatous wall thickening with prominent low-density plaque at the bifurcation. No focal flow reducing stenosis, ulceration, or beading. Mild vessel blurring at the proximal ICA where there is some motion. Left carotid system: Diffuse atheromatous wall thickening with accentuated mixed density plaque at the bifurcation. No flow reducing stenosis, ulceration, or beading Vertebral arteries: Proximal subclavian atherosclerosis greater on the left. The vertebral arteries are smoothly contoured and widely patent to the dura. Skeleton: No acute finding Other neck: No acute or aggressive finding. Small left occipital lipoma. Upper chest: No acute finding Review of the MIP images confirms the above findings CTA HEAD FINDINGS Anterior circulation: Atheromatous calcification of the cervical carotids. No branch occlusion, beading, or aneurysm. Posterior circulation: Fetal type right PCA. The vertebral and basilar arteries are diffusely patent. No branch occlusion, beading, or aneurysm. Venous sinuses: Unremarkable Anatomic variants: None significant Review of the MIP images confirms the above findings IMPRESSION: No emergent arterial finding. Atherosclerosis without flow reducing stenosis or  ulceration of major arteries in the head and neck. Electronically Signed   By: Ronnette Coke M.D.   On: 07/05/2023 07:24   MR BRAIN WO CONTRAST Result Date: 07/05/2023 CLINICAL DATA:  Neuro deficit, acute, stroke suspected EXAM: MRI HEAD WITHOUT CONTRAST TECHNIQUE: Multiplanar, multiecho pulse sequences of the brain and surrounding structures were obtained without intravenous contrast. COMPARISON:  CT head Jul 04, 2023. FINDINGS: Brain: Punctate acute infarct in the right thalamus. No mass effect. No midline shift. No evidence of acute hemorrhage, hydrocephalus or extra-axial fluid collection. Mild for age chronic microvascular ischemic change. Vascular: Major arterial flow voids are maintained at the skull base. Skull and upper cervical spine: Normal marrow signal. Sinuses/Orbits: Clear sinuses.  No acute orbital findings. Other: No mastoid effusions. IMPRESSION: Punctate acute infarct in the right thalamus. Electronically Signed   By: Stevenson Elbe M.D.   On: 07/05/2023 03:34   CT HEAD WO CONTRAST Result Date: 07/04/2023 CLINICAL DATA:  Altered mental status EXAM: CT HEAD WITHOUT CONTRAST TECHNIQUE: Contiguous axial images were obtained from the base of the skull through the vertex without intravenous contrast. RADIATION DOSE REDUCTION: This exam was performed according to the departmental dose-optimization program which includes automated exposure control, adjustment of the mA and/or kV according to patient size and/or use of iterative reconstruction technique. COMPARISON:  None Available. FINDINGS: Brain: No acute intracranial abnormality. Specifically, no hemorrhage, hydrocephalus, mass lesion, acute infarction, or significant intracranial injury. Vascular: No hyperdense vessel or  unexpected calcification. Skull: No acute calvarial abnormality. Sinuses/Orbits: No acute findings Other: None IMPRESSION: No acute intracranial abnormality. Electronically Signed   By: Janeece Mechanic M.D.   On: 07/04/2023  20:10    Time coordinating discharge: 50 mins  SIGNED:  Unk Garb, DO [facility] 07/06/23, 4:16 PM

## 2023-07-11 ENCOUNTER — Encounter: Payer: Self-pay | Admitting: Internal Medicine

## 2023-09-11 ENCOUNTER — Telehealth: Payer: Self-pay | Admitting: *Deleted

## 2023-09-11 ENCOUNTER — Encounter: Payer: Self-pay | Admitting: *Deleted

## 2023-09-11 NOTE — Telephone Encounter (Signed)
 Pt request to call back due to her currently driving to verify card hx.

## 2023-09-16 NOTE — Progress Notes (Unsigned)
 Cardiology Office Note  Date:  09/18/2023   ID:  Edra B Gabrys, DOB 1950-07-08, MRN [id number]  PCP:  [facility]   Chief Complaint  Patient presents with   New Patient (Initial Visit)    Carotid Bruit no complaints today. Meds reviewed verbally with pt.    HPI:  Ms. Kimble B Failla is a 73 year old woman with past medical history of COPD Hyperlipidemia  hypertension GI bleed History of stroke May 2025 Who presents by referral from Dr. Morene Potash for carotid bruit  Presented to the ER May 2025 left-sided numbness right thalamic CVA   CT head No acute intracranial abnormality.  MRI brain Punctate acute infarct in the right thalamus  CTA head/neck  No emergent arterial finding. Atherosclerosis without flow reducing stenosis or ulceration of major arteries in the head and neck.  Echo May 2025 EF 60% Normal RV size and function, no significant valvular heart disease  History of stroke May 2025 Head neck CTA without significant stenosis Bilateral carotids with Diffuse atheromatous wall thickening with prominent low-density plaque at the bifurcation.  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  EKG personally reviewed by myself on todays visit EKG Interpretation Date/Time:  Monday September 18 2023 09:41:35 EDT Ventricular Rate:  81 PR Interval:  146 QRS Duration:  76 QT Interval:  392 QTC Calculation: 455 R Axis:   46  Text Interpretation: Normal sinus rhythm Normal ECG When compared with ECG of 04-Jul-2023 19:16, Nonspecific T wave abnormality no longer evident in Anterior leads Confirmed by Perla Lye [phone]) on 09/18/2023 9:51:11 AM     PMH:   has a past medical history of Anemia, Asthma, COPD (chronic obstructive pulmonary disease) (HCC), GI bleed, Heart murmur, Hypercholesteremia, and Hypertension.  PSH:    Past  Surgical History:  Procedure Laterality Date   HERNIA REPAIR     TUBAL LIGATION      Current Outpatient Medications  Medication Sig Dispense Refill   acetaminophen  (TYLENOL ) 500 MG tablet Take 1,000 mg by mouth every 6 (six) hours as needed for mild pain (pain score 1-3), fever or headache.     albuterol  (PROVENTIL  HFA) 108 (90 Base) MCG/ACT inhaler Inhale 2 puffs into the lungs every 4 (four) hours as needed for wheezing or shortness of breath. 1 Inhaler 0   albuterol  (PROVENTIL ) (2.5 MG/3ML) 0.083% nebulizer solution Take 3 mLs (2.5 mg total) by nebulization every 4 (four) hours as needed for wheezing or shortness of breath. 75 mL 0   aspirin  EC 81 MG tablet Take 81 mg by mouth daily. Swallow whole.     atorvastatin  (LIPITOR) 80 MG tablet Take 1 tablet (80 mg total) by mouth daily. 90 tablet 0   clopidogrel  (PLAVIX ) 75 MG tablet Take 1 tablet (75 mg total) by mouth daily. 90 tablet 0   Coenzyme Q10 (COQ10) 100 MG CAPS Take 100 mg by mouth daily.     Fluticasone-Salmeterol (ADVAIR) 250-50 MCG/DOSE AEPB  (Patient taking differently: as needed.)     hydrochlorothiazide  (MICROZIDE ) 12.5 MG capsule Take 12.5 mg by mouth daily.     losartan  (COZAAR ) 50 MG tablet Take 50 mg by mouth daily.     montelukast  (SINGULAIR ) 10 MG tablet Take 10 mg by mouth daily.     Nebulizers (COMPRESSOR/NEBULIZER) MISC 1 Units by Does not apply route every 4 (four) hours as needed. 1 each 0  omeprazole  (PRILOSEC ) 20 MG capsule Take 20 mg by mouth daily.     JARDIANCE 10 MG TABS tablet JARDIANCE 10 MG TABS (Patient not taking: Reported on 09/18/2023)     No current facility-administered medications for this visit.    Allergies:   Aspirin , Lisinopril, and Nsaids   Social History:  The patient  reports that she quit smoking about 5 years ago. Her smoking use included cigarettes. She has never used smokeless tobacco. She reports current alcohol use of about 3.0 standard drinks of alcohol per week. She reports that she  does not currently use drugs after having used the following drugs: Marijuana and Crack cocaine.   Family History:   family history includes Colon cancer in her father; Esophageal cancer in her sister; Pancreatic cancer in her mother; Throat cancer in her father.    Review of Systems: Review of Systems  Constitutional: Negative.   HENT: Negative.    Respiratory: Negative.    Cardiovascular: Negative.   Gastrointestinal: Negative.   Musculoskeletal: Negative.   Neurological: Negative.   Psychiatric/Behavioral: Negative.    All other systems reviewed and are negative.   PHYSICAL EXAM: VS:  BP 108/60 (BP Location: Right Arm, Cuff Size: Normal)   Pulse 81   Ht 5' 4 (1.626 m)   Wt 177 lb 4 oz (80.4 kg)   SpO2 98%   BMI 30.42 kg/m  , BMI Body mass index is 30.42 kg/m. GEN: Well nourished, well developed, in no acute distress HEENT: normal Neck: no JVD, carotid bruits, or masses Cardiac: RRR; no murmurs, rubs, or gallops,no edema  Respiratory:  clear to auscultation bilaterally, normal work of breathing GI: soft, nontender, nondistended, + BS MS: no deformity or atrophy Skin: warm and dry, no rash Neuro:  Strength and sensation are intact Psych: euthymic mood, full affect  Recent Labs: 07/04/2023: ALT 12; BUN 29; Creatinine, Ser 1.67; Hemoglobin 11.4; Platelets 325; Potassium 4.4; Sodium 142    Lipid Panel Lab Results  Component Value Date   CHOL 147 07/06/2023   HDL 52 07/06/2023   LDLCALC 67 07/06/2023   TRIG 139 07/06/2023      Wt Readings from Last 3 Encounters:  09/18/23 177 lb 4 oz (80.4 kg)  07/04/23 182 lb (82.6 kg)  11/29/22 180 lb (81.6 kg)     ASSESSMENT AND PLAN:  Problem List Items Addressed This Visit       Cardiology Problems   Essential hypertension - Primary   Relevant Medications   aspirin  EC 81 MG tablet   Other Relevant Orders   EKG 12-Lead (Completed)     Other   Dyslipidemia   Relevant Orders   EKG 12-Lead (Completed)   Right  thalamic infarction (HCC)   Relevant Orders   EKG 12-Lead (Completed)   Chronic obstructive pulmonary disease (COPD) (HCC)   Relevant Orders   EKG 12-Lead (Completed)   Obesity, Class I, BMI 30-34.9   Relevant Orders   EKG 12-Lead (Completed)   Tobacco abuse   Relevant Orders   EKG 12-Lead (Completed)   History of stroke Seen by neurology, felt to be secondary to small vessel disease No carotid bruit appreciated on exam Recent CT head neck showing no significant stenosis On aspirin , Plavix , statin No further workup needed at this time  Essential hypertension Blood pressure is well controlled on today's visit. No changes made to the medications.  COPD Remote history of smoking, reports that she stopped several years ago  Hyperlipidemia On Lipitor, goal LDL less  than 70 Most recent LDL 67    Signed, Velinda Lunger, M.D., Ph.D. [facility] [place], [place] [phone]

## 2023-09-18 ENCOUNTER — Encounter: Payer: Self-pay | Admitting: Cardiovascular Disease

## 2023-09-18 ENCOUNTER — Ambulatory Visit: Attending: Cardiovascular Disease | Admitting: Cardiovascular Disease

## 2023-09-18 VITALS — BP 108/60 | HR 81 | Ht 64.0 in | Wt 177.2 lb

## 2023-09-18 DIAGNOSIS — J449 Chronic obstructive pulmonary disease, unspecified: Secondary | ICD-10-CM | POA: Diagnosis not present

## 2023-09-18 DIAGNOSIS — I10 Essential (primary) hypertension: Secondary | ICD-10-CM | POA: Diagnosis not present

## 2023-09-18 DIAGNOSIS — E66811 Obesity, class 1: Secondary | ICD-10-CM

## 2023-09-18 DIAGNOSIS — Z720 Tobacco use: Secondary | ICD-10-CM

## 2023-09-18 DIAGNOSIS — I6381 Other cerebral infarction due to occlusion or stenosis of small artery: Secondary | ICD-10-CM

## 2023-09-18 DIAGNOSIS — E785 Hyperlipidemia, unspecified: Secondary | ICD-10-CM

## 2023-09-18 NOTE — Patient Instructions (Signed)

## 2023-11-17 ENCOUNTER — Other Ambulatory Visit: Payer: Self-pay | Admitting: Physician Assistant

## 2023-11-17 DIAGNOSIS — Z1231 Encounter for screening mammogram for malignant neoplasm of breast: Secondary | ICD-10-CM

## 2023-12-14 ENCOUNTER — Ambulatory Visit
Admission: RE | Admit: 2023-12-14 | Discharge: 2023-12-14 | Disposition: A | Discharge status: Home / Self Care | Source: Ambulatory Visit | Attending: Physician Assistant | Admitting: Physician Assistant

## 2023-12-14 DIAGNOSIS — Z1231 Encounter for screening mammogram for malignant neoplasm of breast: Secondary | ICD-10-CM | POA: Diagnosis present
